# Patient Record
Sex: Female | Born: 1990 | Race: Black or African American | Hispanic: No | Marital: Single | Smoking: Never smoker
Health system: Southern US, Community
[De-identification: ages and names within clinical notes are randomized; demographics above are authoritative.]

## PROBLEM LIST (undated history)

## (undated) DIAGNOSIS — J45909 Unspecified asthma, uncomplicated: Secondary | ICD-10-CM

## (undated) DIAGNOSIS — E669 Obesity, unspecified: Secondary | ICD-10-CM

## (undated) DIAGNOSIS — E079 Disorder of thyroid, unspecified: Secondary | ICD-10-CM

## (undated) HISTORY — DX: Obesity, unspecified: E66.9

## (undated) HISTORY — PX: NO PAST SURGERIES: SHX2092

## (undated) HISTORY — DX: Unspecified asthma, uncomplicated: J45.909

---

## 2015-10-02 ENCOUNTER — Other Ambulatory Visit (HOSPITAL_COMMUNITY)
Admission: RE | Admit: 2015-10-02 | Discharge: 2015-10-02 | Disposition: A | Discharge status: Home / Self Care | Payer: BLUE CROSS/BLUE SHIELD | Source: Ambulatory Visit | Attending: Nurse Practitioner | Admitting: Nurse Practitioner

## 2015-10-02 ENCOUNTER — Other Ambulatory Visit: Payer: Self-pay | Admitting: Nurse Practitioner

## 2015-10-02 DIAGNOSIS — Z113 Encounter for screening for infections with a predominantly sexual mode of transmission: Secondary | ICD-10-CM | POA: Diagnosis present

## 2015-10-02 DIAGNOSIS — Z01411 Encounter for gynecological examination (general) (routine) with abnormal findings: Secondary | ICD-10-CM | POA: Insufficient documentation

## 2015-10-07 LAB — CYTOLOGY - PAP

## 2016-10-05 ENCOUNTER — Other Ambulatory Visit: Payer: Self-pay | Admitting: Nurse Practitioner

## 2016-10-05 ENCOUNTER — Other Ambulatory Visit (HOSPITAL_COMMUNITY)
Admission: RE | Admit: 2016-10-05 | Discharge: 2016-10-05 | Disposition: A | Discharge status: Home / Self Care | Payer: BLUE CROSS/BLUE SHIELD | Source: Ambulatory Visit | Attending: Nurse Practitioner | Admitting: Nurse Practitioner

## 2016-10-05 DIAGNOSIS — Z124 Encounter for screening for malignant neoplasm of cervix: Secondary | ICD-10-CM | POA: Diagnosis present

## 2016-10-08 LAB — CYTOLOGY - PAP
Chlamydia: NEGATIVE
Neisseria Gonorrhea: NEGATIVE

## 2016-11-04 ENCOUNTER — Other Ambulatory Visit: Payer: Self-pay | Admitting: Nurse Practitioner

## 2017-10-06 ENCOUNTER — Other Ambulatory Visit (HOSPITAL_COMMUNITY)
Admission: RE | Admit: 2017-10-06 | Discharge: 2017-10-06 | Disposition: A | Discharge status: Home / Self Care | Payer: BLUE CROSS/BLUE SHIELD | Source: Ambulatory Visit | Attending: Obstetrics and Gynecology | Admitting: Obstetrics and Gynecology

## 2017-10-06 ENCOUNTER — Other Ambulatory Visit: Payer: Self-pay | Admitting: Nurse Practitioner

## 2017-10-06 DIAGNOSIS — Z01419 Encounter for gynecological examination (general) (routine) without abnormal findings: Secondary | ICD-10-CM | POA: Insufficient documentation

## 2017-10-07 LAB — CYTOLOGY - PAP: Diagnosis: NEGATIVE

## 2018-10-10 ENCOUNTER — Other Ambulatory Visit (HOSPITAL_COMMUNITY)
Admission: RE | Admit: 2018-10-10 | Discharge: 2018-10-10 | Disposition: A | Discharge status: Home / Self Care | Payer: BLUE CROSS/BLUE SHIELD | Source: Ambulatory Visit | Attending: Nurse Practitioner | Admitting: Nurse Practitioner

## 2018-10-10 DIAGNOSIS — Z124 Encounter for screening for malignant neoplasm of cervix: Secondary | ICD-10-CM | POA: Insufficient documentation

## 2018-10-26 ENCOUNTER — Inpatient Hospital Stay (HOSPITAL_COMMUNITY)
Admission: EM | Admit: 2018-10-26 | Discharge: 2018-10-31 | DRG: 177 | Disposition: A | Discharge status: Home / Self Care | Payer: HRSA Program | Attending: Internal Medicine | Admitting: Internal Medicine

## 2018-10-26 ENCOUNTER — Emergency Department (HOSPITAL_COMMUNITY): Payer: HRSA Program

## 2018-10-26 ENCOUNTER — Encounter (HOSPITAL_COMMUNITY): Payer: Self-pay | Admitting: Emergency Medicine

## 2018-10-26 ENCOUNTER — Other Ambulatory Visit: Payer: Self-pay

## 2018-10-26 DIAGNOSIS — E876 Hypokalemia: Secondary | ICD-10-CM | POA: Diagnosis present

## 2018-10-26 DIAGNOSIS — Z6836 Body mass index (BMI) 36.0-36.9, adult: Secondary | ICD-10-CM

## 2018-10-26 DIAGNOSIS — J1289 Other viral pneumonia: Secondary | ICD-10-CM | POA: Diagnosis present

## 2018-10-26 DIAGNOSIS — E079 Disorder of thyroid, unspecified: Secondary | ICD-10-CM | POA: Diagnosis present

## 2018-10-26 DIAGNOSIS — U071 COVID-19: Secondary | ICD-10-CM | POA: Diagnosis not present

## 2018-10-26 DIAGNOSIS — E039 Hypothyroidism, unspecified: Secondary | ICD-10-CM | POA: Diagnosis present

## 2018-10-26 DIAGNOSIS — R0602 Shortness of breath: Secondary | ICD-10-CM

## 2018-10-26 DIAGNOSIS — J9601 Acute respiratory failure with hypoxia: Secondary | ICD-10-CM | POA: Diagnosis present

## 2018-10-26 DIAGNOSIS — Z79890 Hormone replacement therapy (postmenopausal): Secondary | ICD-10-CM

## 2018-10-26 DIAGNOSIS — R7401 Elevation of levels of liver transaminase levels: Secondary | ICD-10-CM | POA: Diagnosis present

## 2018-10-26 DIAGNOSIS — E669 Obesity, unspecified: Secondary | ICD-10-CM | POA: Diagnosis present

## 2018-10-26 DIAGNOSIS — J1282 Pneumonia due to coronavirus disease 2019: Secondary | ICD-10-CM | POA: Diagnosis present

## 2018-10-26 HISTORY — DX: Disorder of thyroid, unspecified: E07.9

## 2018-10-26 LAB — COMPREHENSIVE METABOLIC PANEL
ALT: 100 U/L — ABNORMAL HIGH (ref 0–44)
AST: 141 U/L — ABNORMAL HIGH (ref 15–41)
Albumin: 3.6 g/dL (ref 3.5–5.0)
Alkaline Phosphatase: 58 U/L (ref 38–126)
Anion gap: 13 (ref 5–15)
BUN: 6 mg/dL (ref 6–20)
CO2: 25 mmol/L (ref 22–32)
Calcium: 8.8 mg/dL — ABNORMAL LOW (ref 8.9–10.3)
Chloride: 101 mmol/L (ref 98–111)
Creatinine, Ser: 0.83 mg/dL (ref 0.44–1.00)
GFR calc Af Amer: >60 mL/min (ref >60)
GFR calc non Af Amer: >60 mL/min (ref >60)
Glucose, Bld: 139 mg/dL — ABNORMAL HIGH (ref 70–99)
Potassium: 2.9 mmol/L — ABNORMAL LOW (ref 3.5–5.1)
Sodium: 139 mmol/L (ref 135–145)
Total Bilirubin: 0.5 mg/dL (ref 0.3–1.2)
Total Protein: 8 g/dL (ref 6.5–8.1)

## 2018-10-26 LAB — CBC WITH DIFFERENTIAL/PLATELET
Abs Immature Granulocytes: 0.09 10*3/uL — ABNORMAL HIGH (ref 0.00–0.07)
Basophils Absolute: 0 10*3/uL (ref 0.0–0.1)
Basophils Relative: 1 %
Eosinophils Absolute: 0 10*3/uL (ref 0.0–0.5)
Eosinophils Relative: 0 %
HCT: 40.1 % (ref 36.0–46.0)
Hemoglobin: 13 g/dL (ref 12.0–15.0)
Immature Granulocytes: 1 %
Lymphocytes Relative: 33 %
Lymphs Abs: 2.1 10*3/uL (ref 0.7–4.0)
MCH: 30.4 pg (ref 26.0–34.0)
MCHC: 32.4 g/dL (ref 30.0–36.0)
MCV: 93.9 fL (ref 80.0–100.0)
Monocytes Absolute: 0.5 10*3/uL (ref 0.1–1.0)
Monocytes Relative: 8 %
Neutro Abs: 3.7 10*3/uL (ref 1.7–7.7)
Neutrophils Relative %: 57 %
Platelets: 217 10*3/uL (ref 150–400)
RBC: 4.27 MIL/uL (ref 3.87–5.11)
RDW: 13.4 % (ref 11.5–15.5)
WBC: 6.4 10*3/uL (ref 4.0–10.5)
nRBC: 0 % (ref 0.0–0.2)

## 2018-10-26 LAB — I-STAT BETA HCG BLOOD, ED (MC, WL, AP ONLY): I-stat hCG, quantitative: <5 m[IU]/mL (ref <5)

## 2018-10-26 LAB — LACTIC ACID, PLASMA: Lactic Acid, Venous: 1.4 mmol/L (ref 0.5–1.9)

## 2018-10-26 MED ORDER — ALBUTEROL SULFATE (2.5 MG/3ML) 0.083% IN NEBU: 5.0000 mg | INHALATION_SOLUTION | Freq: Once | RESPIRATORY_TRACT | Status: DC

## 2018-10-26 NOTE — ED Triage Notes (Signed)
Pt come to ed via pov,siste4r drove her to [facility], short of breathe and coughing. Her spo2 detector at home showed number of 84.  Pt is alert x4, able to communicate  needs on room air.  Pt was diagnosed at [facility] as covid positive.   Pt has a medical hx of graves disease and hyperthyroidism associated.

## 2018-10-26 NOTE — ED Notes (Signed)
Full rainbow and set of cultures sent down

## 2018-10-26 NOTE — ED Triage Notes (Signed)
Patient is complaining of a cough and sob. Patient has positive covid test from [facility].

## 2018-10-27 ENCOUNTER — Encounter (HOSPITAL_COMMUNITY): Payer: Self-pay | Admitting: Emergency Medicine

## 2018-10-27 DIAGNOSIS — U071 COVID-19: Secondary | ICD-10-CM | POA: Diagnosis present

## 2018-10-27 DIAGNOSIS — E039 Hypothyroidism, unspecified: Secondary | ICD-10-CM | POA: Diagnosis present

## 2018-10-27 DIAGNOSIS — E876 Hypokalemia: Secondary | ICD-10-CM | POA: Diagnosis present

## 2018-10-27 DIAGNOSIS — J9601 Acute respiratory failure with hypoxia: Secondary | ICD-10-CM | POA: Diagnosis present

## 2018-10-27 DIAGNOSIS — J1282 Pneumonia due to coronavirus disease 2019: Secondary | ICD-10-CM | POA: Diagnosis present

## 2018-10-27 DIAGNOSIS — R7401 Elevation of levels of liver transaminase levels: Secondary | ICD-10-CM

## 2018-10-27 DIAGNOSIS — Z6836 Body mass index (BMI) 36.0-36.9, adult: Secondary | ICD-10-CM | POA: Diagnosis not present

## 2018-10-27 DIAGNOSIS — E669 Obesity, unspecified: Secondary | ICD-10-CM | POA: Diagnosis present

## 2018-10-27 DIAGNOSIS — E079 Disorder of thyroid, unspecified: Secondary | ICD-10-CM

## 2018-10-27 DIAGNOSIS — J1289 Other viral pneumonia: Secondary | ICD-10-CM

## 2018-10-27 DIAGNOSIS — Z79890 Hormone replacement therapy (postmenopausal): Secondary | ICD-10-CM | POA: Diagnosis not present

## 2018-10-27 DIAGNOSIS — R0602 Shortness of breath: Secondary | ICD-10-CM | POA: Diagnosis present

## 2018-10-27 LAB — MAGNESIUM
Magnesium: 2.1 mg/dL (ref 1.7–2.4)
Magnesium: 2.9 mg/dL — ABNORMAL HIGH (ref 1.7–2.4)

## 2018-10-27 LAB — CBC WITH DIFFERENTIAL/PLATELET
Abs Immature Granulocytes: 0.14 10*3/uL — ABNORMAL HIGH (ref 0.00–0.07)
Basophils Absolute: 0 10*3/uL (ref 0.0–0.1)
Basophils Relative: 1 %
Eosinophils Absolute: 0 10*3/uL (ref 0.0–0.5)
Eosinophils Relative: 0 %
HCT: 39.9 % (ref 36.0–46.0)
Hemoglobin: 12.9 g/dL (ref 12.0–15.0)
Immature Granulocytes: 3 %
Lymphocytes Relative: 27 %
Lymphs Abs: 1.5 10*3/uL (ref 0.7–4.0)
MCH: 30.5 pg (ref 26.0–34.0)
MCHC: 32.3 g/dL (ref 30.0–36.0)
MCV: 94.3 fL (ref 80.0–100.0)
Monocytes Absolute: 0.3 10*3/uL (ref 0.1–1.0)
Monocytes Relative: 5 %
Neutro Abs: 3.6 10*3/uL (ref 1.7–7.7)
Neutrophils Relative %: 64 %
Platelets: 238 10*3/uL (ref 150–400)
RBC: 4.23 MIL/uL (ref 3.87–5.11)
RDW: 13.5 % (ref 11.5–15.5)
WBC: 5.5 10*3/uL (ref 4.0–10.5)
nRBC: 0 % (ref 0.0–0.2)

## 2018-10-27 LAB — SARS CORONAVIRUS 2 BY RT PCR (HOSPITAL ORDER, PERFORMED IN ~~LOC~~ HOSPITAL LAB): SARS Coronavirus 2: POSITIVE — AB

## 2018-10-27 LAB — COMPREHENSIVE METABOLIC PANEL
ALT: 104 U/L — ABNORMAL HIGH (ref 0–44)
AST: 148 U/L — ABNORMAL HIGH (ref 15–41)
Albumin: 3.7 g/dL (ref 3.5–5.0)
Alkaline Phosphatase: 54 U/L (ref 38–126)
Anion gap: 10 (ref 5–15)
BUN: 6 mg/dL (ref 6–20)
CO2: 24 mmol/L (ref 22–32)
Calcium: 8.6 mg/dL — ABNORMAL LOW (ref 8.9–10.3)
Chloride: 104 mmol/L (ref 98–111)
Creatinine, Ser: 0.77 mg/dL (ref 0.44–1.00)
GFR calc Af Amer: >60 mL/min (ref >60)
GFR calc non Af Amer: >60 mL/min (ref >60)
Glucose, Bld: 160 mg/dL — ABNORMAL HIGH (ref 70–99)
Potassium: 4.3 mmol/L (ref 3.5–5.1)
Sodium: 138 mmol/L (ref 135–145)
Total Bilirubin: 0.4 mg/dL (ref 0.3–1.2)
Total Protein: 8.1 g/dL (ref 6.5–8.1)

## 2018-10-27 LAB — ABO/RH: ABO/RH(D): A POS

## 2018-10-27 LAB — PHOSPHORUS: Phosphorus: 2.7 mg/dL (ref 2.5–4.6)

## 2018-10-27 LAB — PROCALCITONIN: Procalcitonin: <0.1 ng/mL

## 2018-10-27 LAB — FERRITIN: Ferritin: 979 ng/mL — ABNORMAL HIGH (ref 11–307)

## 2018-10-27 LAB — C-REACTIVE PROTEIN: CRP: 11.5 mg/dL — ABNORMAL HIGH (ref <1.0)

## 2018-10-27 LAB — HIV ANTIBODY (ROUTINE TESTING W REFLEX): HIV Screen 4th Generation wRfx: NONREACTIVE

## 2018-10-27 LAB — D-DIMER, QUANTITATIVE: D-Dimer, Quant: 0.8 ug/mL-FEU — ABNORMAL HIGH (ref 0.00–0.50)

## 2018-10-27 MED ORDER — LEVOTHYROXINE SODIUM 75 MCG PO TABS
175.0000 ug | ORAL_TABLET | Freq: Every day | ORAL | Status: DC
  Administered 2018-10-27: 175 ug via ORAL
  Filled 2018-10-27: qty 1

## 2018-10-27 MED ORDER — ONDANSETRON HCL 4 MG PO TABS: 4.0000 mg | ORAL_TABLET | Freq: Four times a day (QID) | ORAL | Status: DC | PRN

## 2018-10-27 MED ORDER — SENNOSIDES-DOCUSATE SODIUM 8.6-50 MG PO TABS: 1.0000 | ORAL_TABLET | Freq: Every evening | ORAL | Status: DC | PRN

## 2018-10-27 MED ORDER — LEVOTHYROXINE SODIUM 75 MCG PO TABS
175.0000 ug | ORAL_TABLET | Freq: Every day | ORAL | Status: DC
  Administered 2018-10-28 – 2018-10-31 (×4): 175 ug via ORAL
  Filled 2018-10-27 (×4): qty 1

## 2018-10-27 MED ORDER — DEXAMETHASONE SODIUM PHOSPHATE 10 MG/ML IJ SOLN
6.0000 mg | INTRAMUSCULAR | Status: DC
  Administered 2018-10-27 – 2018-10-29 (×3): 6 mg via INTRAVENOUS
  Filled 2018-10-27 (×3): qty 1

## 2018-10-27 MED ORDER — ALBUTEROL SULFATE HFA 108 (90 BASE) MCG/ACT IN AERS
2.0000 | INHALATION_SPRAY | Freq: Four times a day (QID) | RESPIRATORY_TRACT | Status: DC
  Administered 2018-10-27: 2 via RESPIRATORY_TRACT
  Filled 2018-10-27: qty 6.7

## 2018-10-27 MED ORDER — ENOXAPARIN SODIUM 60 MG/0.6ML ~~LOC~~ SOLN
50.0000 mg | SUBCUTANEOUS | Status: DC
  Administered 2018-10-28 – 2018-10-31 (×4): 50 mg via SUBCUTANEOUS
  Filled 2018-10-27 (×6): qty 0.6

## 2018-10-27 MED ORDER — GUAIFENESIN 100 MG/5ML PO SOLN: 5.0000 mL | ORAL | Status: DC | PRN

## 2018-10-27 MED ORDER — ENOXAPARIN SODIUM 40 MG/0.4ML ~~LOC~~ SOLN
40.0000 mg | Freq: Every day | SUBCUTANEOUS | Status: DC
  Administered 2018-10-27: 40 mg via SUBCUTANEOUS
  Filled 2018-10-27: qty 0.4

## 2018-10-27 MED ORDER — DEXAMETHASONE SODIUM PHOSPHATE 10 MG/ML IJ SOLN: 6.0000 mg | INTRAMUSCULAR | Status: DC

## 2018-10-27 MED ORDER — ACETAMINOPHEN 325 MG PO TABS
650.0000 mg | ORAL_TABLET | Freq: Four times a day (QID) | ORAL | Status: DC | PRN
  Administered 2018-10-27: 650 mg via ORAL
  Filled 2018-10-27: qty 2

## 2018-10-27 MED ORDER — POTASSIUM CHLORIDE CRYS ER 20 MEQ PO TBCR
40.0000 meq | EXTENDED_RELEASE_TABLET | Freq: Once | ORAL | Status: AC
  Administered 2018-10-27: 40 meq via ORAL
  Filled 2018-10-27: qty 2

## 2018-10-27 MED ORDER — ONDANSETRON HCL 4 MG/2ML IJ SOLN: 4.0000 mg | Freq: Four times a day (QID) | INTRAMUSCULAR | Status: DC | PRN

## 2018-10-27 MED ORDER — SODIUM CHLORIDE 0.9% FLUSH
3.0000 mL | Freq: Two times a day (BID) | INTRAVENOUS | Status: DC
  Administered 2018-10-27 – 2018-10-29 (×5): 3 mL via INTRAVENOUS

## 2018-10-27 MED ORDER — SODIUM CHLORIDE 0.9% FLUSH
3.0000 mL | Freq: Two times a day (BID) | INTRAVENOUS | Status: DC
  Administered 2018-10-28 – 2018-10-31 (×6): 3 mL via INTRAVENOUS

## 2018-10-27 MED ORDER — DEXAMETHASONE SODIUM PHOSPHATE 10 MG/ML IJ SOLN
6.0000 mg | Freq: Once | INTRAMUSCULAR | Status: AC
  Administered 2018-10-27: 6 mg via INTRAVENOUS
  Filled 2018-10-27: qty 1

## 2018-10-27 MED ORDER — SODIUM CHLORIDE 0.9% FLUSH: 3.0000 mL | INTRAVENOUS | Status: DC | PRN

## 2018-10-27 MED ORDER — SODIUM CHLORIDE 0.9 % IV SOLN: 100.0000 mg | INTRAVENOUS | Status: DC

## 2018-10-27 MED ORDER — ZOLPIDEM TARTRATE 5 MG PO TABS: 5.0000 mg | ORAL_TABLET | Freq: Every evening | ORAL | Status: DC | PRN

## 2018-10-27 MED ORDER — SODIUM CHLORIDE 0.9 % IV SOLN
100.0000 mg | INTRAVENOUS | Status: DC
  Administered 2018-10-28 – 2018-10-30 (×3): 100 mg via INTRAVENOUS
  Filled 2018-10-27 (×4): qty 20

## 2018-10-27 MED ORDER — SODIUM CHLORIDE 0.9 % IV SOLN: 250.0000 mL | INTRAVENOUS | Status: DC | PRN

## 2018-10-27 MED ORDER — MAGNESIUM SULFATE IN D5W 1-5 GM/100ML-% IV SOLN
1.0000 g | Freq: Once | INTRAVENOUS | Status: AC
  Administered 2018-10-27: 1 g via INTRAVENOUS
  Filled 2018-10-27: qty 100

## 2018-10-27 MED ORDER — SODIUM CHLORIDE 0.9 % IV SOLN
200.0000 mg | Freq: Once | INTRAVENOUS | Status: AC
  Administered 2018-10-27: 200 mg via INTRAVENOUS
  Filled 2018-10-27: qty 40

## 2018-10-27 MED ORDER — HYDROCODONE-ACETAMINOPHEN 5-325 MG PO TABS: 1.0000 | ORAL_TABLET | ORAL | Status: DC | PRN

## 2018-10-27 MED ORDER — POTASSIUM CHLORIDE 10 MEQ/100ML IV SOLN
10.0000 meq | INTRAVENOUS | Status: AC
  Administered 2018-10-27 (×2): 10 meq via INTRAVENOUS
  Filled 2018-10-27 (×2): qty 100

## 2018-10-27 NOTE — Progress Notes (Signed)
CXR shows Patchy airspace opacities in both lungs, right greater than left. The findings in the lungs are nonspecific, but concerning for atypical infection, which includes viral pneumonia.  Pt requiring supplemental oxygen (Yes--2L) ALT 100  Plan: Remdesivir 200mg  IV now then 100mg  IV daily x 4 days Will f/u ALT and pt's clinical condition

## 2018-10-27 NOTE — Plan of Care (Signed)
Called to give update to mom - but no answer. Left voicemail with return number to call. Waiting on return call.

## 2018-10-27 NOTE — ED Notes (Signed)
[facility] called for patient transfer to [facility]. Attempted to call report to [facility]- was told the RN is in a room and unavailable at this time. Will re-attempt.

## 2018-10-27 NOTE — ED Provider Notes (Signed)
[facility] DEPT Provider Note   CSN: [id number] Arrival date & time: 10/26/18  2155     History   Chief Complaint Chief Complaint  Patient presents with  . Cough  . Covid Positive  . Shortness of Breath    started on tuesday     HPI Diana Chavez is a 28 y.o. female.     The history is provided by the patient.  Cough Cough characteristics:  Non-productive Severity:  Moderate Onset quality:  Gradual Duration:  7 days Timing:  Intermittent Progression:  Worsening Chronicity:  New Context: sick contacts   Context: not animal exposure   Relieved by:  Nothing Worsened by:  Nothing Ineffective treatments:  None tried Associated symptoms: chills, fever and shortness of breath   Associated symptoms comment:  Anosmia and loss of taste Risk factors: no recent infection   Shortness of Breath Associated symptoms: cough and fever   Associated symptoms: no abdominal pain   Patient started having symptoms on Friday.  Tested positive for COVID on Tuesday.  SOB began on Tuesday.    Past Medical History:  Diagnosis Date  . Thyroid disease     There are no active problems to display for this patient.   History reviewed. No pertinent surgical history.   OB History   No obstetric history on file.      Home Medications    Prior to Admission medications   Medication Sig Start Date End Date Taking? Authorizing Provider  albuterol (VENTOLIN HFA) 108 (90 Base) MCG/ACT inhaler Inhale 1-2 puffs into the lungs every 6 (six) hours as needed for wheezing or shortness of breath.   Yes [provider]  levothyroxine (SYNTHROID) 175 MCG tablet Take 175 mcg by mouth daily before breakfast.   Yes [provider]    Family History No family history on file.  Social History Social History   Tobacco Use  . Smoking status: Not on file  Substance Use Topics  . Alcohol use: Not on file  . Drug use: Not on file      Allergies   Patient has no known allergies.   Review of Systems Review of Systems  Constitutional: Positive for chills and fever.  HENT: Negative for voice change.   Eyes: Negative for visual disturbance.  Respiratory: Positive for cough and shortness of breath.   Gastrointestinal: Negative for abdominal pain.  Genitourinary: Negative for difficulty urinating.  Musculoskeletal: Negative for arthralgias.  Neurological: Negative for weakness.  Psychiatric/Behavioral: Negative for agitation.  All other systems reviewed and are negative.    Physical Exam Updated Vital Signs BP 125/88   Pulse 92   Temp 99.8 F (37.7 C) (Oral)   Resp (!) 30   Ht 5\' 5"  (1.651 m)   SpO2 95%   Physical Exam Vitals signs and nursing note reviewed.  Constitutional:      General: She is not in acute distress. HENT:     Head: Normocephalic and atraumatic.     Nose: Nose normal.  Eyes:     Conjunctiva/sclera: Conjunctivae normal.     Pupils: Pupils are equal, round, and reactive to light.  Neck:     Musculoskeletal: Normal range of motion and neck supple.  Cardiovascular:     Rate and Rhythm: Regular rhythm. Tachycardia present.     Pulses: Normal pulses.     Heart sounds: Normal heart sounds.  Pulmonary:     Effort: Tachypnea present.     Breath sounds: No stridor.  Abdominal:     General: Abdomen is flat. Bowel sounds are normal.     Tenderness: There is no abdominal tenderness. There is no guarding.  Musculoskeletal: Normal range of motion.  Skin:    General: Skin is warm and dry.     Capillary Refill: Capillary refill takes less than 2 seconds.  Neurological:     General: No focal deficit present.     Mental Status: She is alert and oriented to person, place, and time.     Deep Tendon Reflexes: Reflexes normal.  Psychiatric:        Mood and Affect: Mood normal.        Behavior: Behavior normal.      ED Treatments / Results  Labs (all labs ordered are listed, but only  abnormal results are displayed) Results for orders placed or performed during the hospital encounter of 10/26/18  SARS Coronavirus 2 by RT PCR (hospital order, performed in [facility] hospital lab) Nasopharyngeal Nasopharyngeal Swab   Specimen: Nasopharyngeal Swab  Result Value Ref Range   SARS Coronavirus 2 POSITIVE (A) NEGATIVE  CBC with Differential  Result Value Ref Range   WBC 6.4 4.0 - 10.5 K/uL   RBC 4.27 3.87 - 5.11 MIL/uL   Hemoglobin 13.0 12.0 - 15.0 g/dL   HCT 47.0 96.2 - 83.6 %   MCV 93.9 80.0 - 100.0 fL   MCH 30.4 26.0 - 34.0 pg   MCHC 32.4 30.0 - 36.0 g/dL   RDW 62.9 47.6 - 54.6 %   Platelets 217 150 - 400 K/uL   nRBC 0.0 0.0 - 0.2 %   Neutrophils Relative % 57 %   Neutro Abs 3.7 1.7 - 7.7 K/uL   Lymphocytes Relative 33 %   Lymphs Abs 2.1 0.7 - 4.0 K/uL   Monocytes Relative 8 %   Monocytes Absolute 0.5 0.1 - 1.0 K/uL   Eosinophils Relative 0 %   Eosinophils Absolute 0.0 0.0 - 0.5 K/uL   Basophils Relative 1 %   Basophils Absolute 0.0 0.0 - 0.1 K/uL   Immature Granulocytes 1 %   Abs Immature Granulocytes 0.09 (H) 0.00 - 0.07 K/uL   Reactive, Benign Lymphocytes PRESENT   Comprehensive metabolic panel  Result Value Ref Range   Sodium 139 135 - 145 mmol/L   Potassium 2.9 (L) 3.5 - 5.1 mmol/L   Chloride 101 98 - 111 mmol/L   CO2 25 22 - 32 mmol/L   Glucose, Bld 139 (H) 70 - 99 mg/dL   BUN 6 6 - 20 mg/dL   Creatinine, Ser 5.03 0.44 - 1.00 mg/dL   Calcium 8.8 (L) 8.9 - 10.3 mg/dL   Total Protein 8.0 6.5 - 8.1 g/dL   Albumin 3.6 3.5 - 5.0 g/dL   AST 546 (H) 15 - 41 U/L   ALT 100 (H) 0 - 44 U/L   Alkaline Phosphatase 58 38 - 126 U/L   Total Bilirubin 0.5 0.3 - 1.2 mg/dL   GFR calc non Af Amer >60 >60 mL/min   GFR calc Af Amer >60 >60 mL/min   Anion gap 13 5 - 15  Lactic acid, plasma  Result Value Ref Range   Lactic Acid, Venous 1.4 0.5 - 1.9 mmol/L  I-Stat Beta hCG blood, ED ([facility], [facility], [facility] only)  Result Value Ref Range   I-stat hCG, quantitative <5.0 <5  mIU/mL   Comment 3           Dg Chest Port 1 View  Result Date:  10/26/2018 CLINICAL DATA:  Covid positive, cough EXAM: PORTABLE CHEST 1 VIEW COMPARISON:  None. FINDINGS: The heart size and mediastinal contours are within normal limits. Patchy airspace consolidation seen at the base of the right lung and left mid lung. Overall shallow degree of aeration. IMPRESSION: Patchy airspace opacities in both lungs, right greater than left. The findings in the lungs are nonspecific, but concerning for atypical infection, which includes viral pneumonia. Electronically Signed   By: Jonna ClarkBindu  Avutu M.D.   On: 10/26/2018 22:43    EKG None  [facility]  Result Date: 10/26/2018 CLINICAL DATA:  Covid positive, cough EXAM: PORTABLE CHEST 1 VIEW COMPARISON:  None. FINDINGS: The heart size and mediastinal contours are within normal limits. Patchy airspace consolidation seen at the base of the right lung and left mid lung. Overall shallow degree of aeration. IMPRESSION: Patchy airspace opacities in both lungs, right greater than left. The findings in the lungs are nonspecific, but concerning for atypical infection, which includes viral pneumonia. Electronically Signed   By: Jonna ClarkBindu  Avutu M.D.   On: 10/26/2018 22:43    Procedures Procedures (including critical care time)  Medications Ordered in ED Medications  dexamethasone (DECADRON) injection 6 mg (has no administration in time range)     Initial Impression / Assessment and Plan / ED Course  Patient is hypoxic on room air and Desaturated with ambulation.  She will require admission.    Final Clinical Impressions(s) / ED Diagnoses   Final diagnoses:  COVID-19    Admit to medicine   Diavian Furgason, MD 10/27/18 [id number]

## 2018-10-27 NOTE — H&P (Signed)
History and Physical    Menucha Dicesare OEU:[id number] DOB: June 04, 1990 DOA: 10/26/2018  PCP: Patient, No Pcp Per   Patient coming from: Home   Chief Complaint: SOB, cough, fevers, COVID-19 positive   HPI: Brittnie Bertoli is a 28 y.o. female with medical history significant for thyroid disease, now presenting to the emergency department for evaluation of cough, shortness of breath, fevers, and reports that she was positive for COVID-19 a week ago.  Patient developed fevers, cough, loss of taste and smell, and shortness of breath on 10/17/2018, tested positive for the novel coronavirus the following day, and now presents with worsening shortness of breath.  Patient reports that she continues to have fevers, chills, and cough, but her main concern is her dyspnea.  She is short of breath even at rest now.  She denies any history of asthma or underlying heart or lung disease, but was given an albuterol inhaler that she has been using during this illness, but has not noted any improvement with this.  She has some mild nausea, has vomited once, denies abdominal pain, denies diarrhea, and denies chest pain.  ED Course: Upon arrival to the ED, patient is found to be afebrile, saturating upper 80s on room air while at rest, tachypneic in the 30s, slightly tachycardic, and with stable blood pressure.  EKG features sinus tachycardia with rate 105.  Chest x-ray is notable for patchy airspace opacities bilaterally concerning for atypical infection.  Chemistry panel is notable for potassium 2.9, AST 141, and ALT of 100.  CBC is unremarkable.  Lactic acid is normal.  COVID-19 testing is positive.  Blood cultures were collected in the ED and the patient was treated with Decadron and started on supplemental oxygen.  Review of Systems:  All other systems reviewed and apart from HPI, are negative.  Past Medical History:  Diagnosis Date  . Thyroid disease     History reviewed. No pertinent surgical history.    has no history on file for tobacco, alcohol, and drug.  No Known Allergies  History reviewed. No pertinent family history.   Prior to Admission medications   Medication Sig Start Date End Date Taking? Authorizing Provider  albuterol (VENTOLIN HFA) 108 (90 Base) MCG/ACT inhaler Inhale 1-2 puffs into the lungs every 6 (six) hours as needed for wheezing or shortness of breath.   Yes [provider]  levothyroxine (SYNTHROID) 175 MCG tablet Take 175 mcg by mouth daily before breakfast.   Yes [provider]    Physical Exam: Vitals:   10/26/18 2255 10/27/18 0100 10/27/18 0130 10/27/18 0200  BP:  125/88 138/89 138/85  Pulse:  92 91 91  Resp:  (!) 30 (!) 26 (!) 33  Temp:      TempSrc:      SpO2: (!) 89% 95% 92% 92%  Height: 5\' 5"  (1.651 m)       Constitutional: NAD, calm  Eyes: PERTLA, lids and conjunctivae normal ENMT: Mucous membranes are moist. Posterior pharynx clear of any exudate or lesions.   Neck: normal, supple, no masses, no thyromegaly Respiratory: Tachypneic at rest. Speaking full sentences. Scattered rhonchi bilaterally. No pallor or cyanosis.  Cardiovascular: S1 & S2 heard, regular rate and rhythm. No extremity edema.   Abdomen: No distension, no tenderness, soft. Bowel sounds normal.  Musculoskeletal: no clubbing / cyanosis. No joint deformity upper and lower extremities.    Skin: no significant rashes, lesions, ulcers. Warm, dry, well-perfused. Neurologic: No facial asymmetry. Sensation intact. Moving all extremities.  Psychiatric: Alert  and oriented to person, place, and situation. Calm, cooperative.    Labs on Admission: I have personally reviewed following labs and imaging studies  CBC: Recent Labs  Lab 10/26/18 2321  WBC 6.4  NEUTROABS 3.7  HGB 13.0  HCT 40.1  MCV 93.9  PLT 217   Basic Metabolic Panel: Recent Labs  Lab 10/26/18 2321  NA 139  K 2.9*  CL 101  CO2 25  GLUCOSE 139*  BUN 6  CREATININE 0.83  CALCIUM 8.8*    GFR: CrCl cannot be calculated (Unknown ideal weight.). Liver Function Tests: Recent Labs  Lab 10/26/18 2321  AST 141*  ALT 100*  ALKPHOS 58  BILITOT 0.5  PROT 8.0  ALBUMIN 3.6   No results for input(s): LIPASE, AMYLASE in the last 168 hours. No results for input(s): AMMONIA in the last 168 hours. Coagulation Profile: No results for input(s): INR, PROTIME in the last 168 hours. Cardiac Enzymes: No results for input(s): CKTOTAL, CKMB, CKMBINDEX, TROPONINI in the last 168 hours. BNP (last 3 results) No results for input(s): PROBNP in the last 8760 hours. HbA1C: No results for input(s): HGBA1C in the last 72 hours. CBG: No results for input(s): GLUCAP in the last 168 hours. Lipid Profile: No results for input(s): CHOL, HDL, LDLCALC, TRIG, CHOLHDL, LDLDIRECT in the last 72 hours. Thyroid Function Tests: No results for input(s): TSH, T4TOTAL, FREET4, T3FREE, THYROIDAB in the last 72 hours. Anemia Panel: No results for input(s): VITAMINB12, FOLATE, FERRITIN, TIBC, IRON, RETICCTPCT in the last 72 hours. Urine analysis: No results found for: COLORURINE, APPEARANCEUR, LABSPEC, PHURINE, GLUCOSEU, HGBUR, BILIRUBINUR, KETONESUR, PROTEINUR, UROBILINOGEN, NITRITE, LEUKOCYTESUR Sepsis Labs: @LABRCNTIP (procalcitonin:4,lacticidven:4) ) Recent Results (from the past 240 hour(s))  SARS Coronavirus 2 by RT PCR (hospital order, [facility]) Nasopharyngeal Nasopharyngeal Swab     Status: Abnormal   Collection Time: 10/27/18 12:10 AM   Specimen: Nasopharyngeal Swab  Result Value Ref Range Status   SARS Coronavirus 2 POSITIVE (A) NEGATIVE Final    Comment: RESULT CALLED TO, READ BACK BY AND VERIFIED WITH: Mervyn GayJOHN FRICKIE @ 0153 ON 10/27/2018 C VARNER (NOTE) If result is NEGATIVE SARS-CoV-2 target nucleic acids are NOT DETECTED. The SARS-CoV-2 RNA is generally detectable in upper and lower  respiratory specimens during the acute phase of infection. The lowest   concentration of SARS-CoV-2 viral copies this assay can detect is 250  copies / mL. A negative result does not preclude SARS-CoV-2 infection  and should not be used as the sole basis for treatment or other  patient management decisions.  A negative result may occur with  improper specimen collection / handling, submission of specimen other  than nasopharyngeal swab, presence of viral mutation(s) within the  areas targeted by this assay, and inadequate number of viral copies  (<250 copies / mL). A negative result must be combined with clinical  observations, patient history, and epidemiological information. If result is POSITIVE SARS-CoV-2 target nucleic acids are DETEC TED. The SARS-CoV-2 RNA is generally detectable in upper and lower  respiratory specimens during the acute phase of infection.  Positive  results are indicative of active infection with SARS-CoV-2.  Clinical  correlation with patient history and other diagnostic information is  necessary to determine patient infection status.  Positive results do  not rule out bacterial infection or co-infection with other viruses. If result is PRESUMPTIVE POSTIVE SARS-CoV-2 nucleic acids MAY BE PRESENT.   A presumptive positive result was obtained on the submitted specimen  and confirmed on repeat  testing.  While 2019 novel coronavirus  (SARS-CoV-2) nucleic acids may be present in the submitted sample  additional confirmatory testing may be necessary for epidemiological  and / or clinical management purposes  to differentiate between  SARS-CoV-2 and other Sarbecovirus currently known to infect humans.  If clinically indicated additional testing with an alternate test  methodology (LAB7 453) is advised. The SARS-CoV-2 RNA is generally  detectable in upper and lower respiratory specimens during the acute  phase of infection. The expected result is Negative. Fact Sheet for Patients:  BoilerBrush.com.cy Fact Sheet  for Healthcare Providers: https://pope.com/ This test is not yet approved or cleared by the [place] FDA and has been authorized for detection and/or diagnosis of SARS-CoV-2 by FDA under an Emergency Use Authorization (EUA).  This EUA will remain in effect (meaning this test can be used) for the duration of the COVID-19 declaration under Section 564(b)(1) of the Act, 21 U.S.C. section 360bbb-3(b)(1), unless the authorization is terminated or revoked sooner. Performed at [facility], [address] [place], [place] [address]      Radiological Exams on Admission: Dg Chest Port 1 View  Result Date: 10/26/2018 CLINICAL DATA:  Covid positive, cough EXAM: PORTABLE CHEST 1 VIEW COMPARISON:  None. FINDINGS: The heart size and mediastinal contours are within normal limits. Patchy airspace consolidation seen at the base of the right lung and left mid lung. Overall shallow degree of aeration. IMPRESSION: Patchy airspace opacities in both lungs, right greater than left. The findings in the lungs are nonspecific, but concerning for atypical infection, which includes viral pneumonia. Electronically Signed   By: Jonna Clark M.D.   On: 10/26/2018 22:43    EKG: Independently reviewed. Sinus tachycardia (rate 105), QTc 443 ms.   Assessment/Plan   1. Pneumonia due to COVID-19; acute hypoxic respiratory failure  - Patient reports fevers, cough, and SOB since 10/13, was positive for COVID-19 the following day, has become progressively dyspneic, and is found to be tachypneic in 30's with new 2 Lpm supplemental O2 requirement, and CXR-findings consistent with viral PNA  - Blood cultures were collected in the ED, she was started on 2 Lpm supplemental O2, and treated with Decadron  - Check/trend inflammatory markers, consider antibiotics if procalcitonin elevated, continue Decadron, start remdesivir, continue supplemental O2 as needed   2. Hypokalemia  - Serum  potassium is 2.9 on admission  - Replace, continue cardiac monitoring for now, repeat chemistries in am   3. Elevated transaminases - AST is 141 and ALT 100 on admission with no prior labs available for comparison  - Abdominal exam benign, likely secondary to the viral infection, will monitor   4. Hypothyroidism  - Continue Synthroid    PPE: CAPR, gown, gloves  DVT prophylaxis: Lovenox  Code Status: Full  Family Communication: Family updated by phone  Consults called: none  Admission status: Inpatient. Patient is tachypneic to mid-30's and hypoxic at rest, she can not be reasonably expected to improve enough for safe discharge within the observation timeframe and will require careful inpatient management.     Briscoe Deutscher, MD [facility] Pager [phone]  If 7PM-7AM, please contact night-coverage [email or web address] Password [email or web address]  10/27/2018, 2:11 AM

## 2018-10-27 NOTE — ED Notes (Signed)
[facility] present to transfer patient to [facility]

## 2018-10-27 NOTE — Progress Notes (Addendum)
Care started prior to midnight in the ED yesterday and she was admitted after midnight early this morning by my colleague Dr. Christia Reading Opyd and I am in current agreement with his assessment and plan.  Additional changes to the plan of care been made accordingly.  Patient's chart was reviewed earlier this morning and she is currently in the process of being transferred to [facility] to Dr. Sander Radon.  She presented with shortness of breath that started on 10/17/2018, cough, fevers, loss of taste and smell, and tested positive for COVID-19 disease on 10/18/2018 and now presented with worsening shortness of breath and progressive dyspnea with tachypnea.  She was found to have a new oxygen requirement required at least 2 L supplemental oxygen via nasal cannula and chest x-ray findings were consistent with a viral pneumonia.  She had blood cultures taken in the ED and started on IV Decadron and remdesivir.  Repeat laboratory work is still pending this morning but has been ordered.  Her potassium level was low on admission and is currently being replaced.  She will be transferred to [facility] and seen by Dr. Riccardo Dubin Arrien later today and further care per [facility] protocols.

## 2018-10-27 NOTE — ED Notes (Signed)
Pt requests her mom be updated in the morning, after 7am

## 2018-10-27 NOTE — Progress Notes (Signed)
PROGRESS NOTE    Diana Chavez  DXA:[id number] DOB: 11-Sep-1990 DOA: 10/26/2018 PCP: Patient, No Pcp Per    Brief Narrative:  28 year old female who presented with dyspnea, cough and fevers.  She does have significant past medical history for hypothyroidism.  She tested positive for COVID-19 on October 18, 2018, patient had persistent fevers, dyspnea, chills, and cough.  She was noted to be dyspneic at rest.  On her initial physical examination her oximetry was 80% on room air, respiratory rate 30 breaths/min, heart rate 92.  She was tachypneic, her lungs had rhonchi bilaterally, heart S1-S2 present rhythmic, tachycardic, abdomen soft, no lower extremity edema. Sodium 139, potassium 2.9, chloride 101, bicarb 25, glucose 139, BUN 6, creatinine 0.83, white count 6.4, hemoglobin 13.0, hematocrit 40.1, platelets 217.  SARS COVID-19 positive.  Her chest x-ray had hypoinflation, reticular nodular infiltrate at the right base, interstitial infiltrate left upper lobe.  EKG 105 bpm, normal axis, normal intervals, sinus rhythm, no ST segment changes, negative T waves in the inferior lateral leads.  Patient was admitted to the hospital with working diagnosis of acute hypoxic respiratory failure due to SARS COVID-19 viral pneumonia.   Assessment & Plan:   Principal Problem:   Pneumonia due to COVID-19 virus Active Problems:   Hypokalemia   Elevated transaminase level   Acute respiratory failure with hypoxia (HCC)   Thyroid disease   1. Acute hypoxic respiratory failure due to SARS COVID 19 viral pneumonia. Patient continue to have tachypnea and hypoxemia, no frank dyspnea (silent hypoxemia).   RR: 28 to 32 Fi02: 28% on 2 LPM per [place]  Pulse oxymetry: 92 to 94 %  COVID-19 Labs  Recent Labs    10/27/18 0544  DDIMER 0.80*  FERRITIN 979*  CRP 11.5*    Continue medical therapy with Remdesivir #1/5, systemic corticosteroids with dexamethasone, 6 mg IV q 24 H. Guaifenesin, vitamin C and  zinc. Will continue close monitoring of oxymetry.   2. Hypokalemia and hypomagnesemia. K up to 4,3 and Mg at 2,9 after correction with Kcl and Mg sulfate. Renal function stable with serum cr at 0,77. Will continue to follow renal function in am.   3. Obesity. Calculated BMI is 36,6.   4. Hypothyroid. Continue with levothyroxine.     DVT prophylaxis: enoxaparin   Code Status: full Family Communication: no family at the bedside  Disposition Plan/ discharge barriers: pending clinical improvement.   Body mass index is 36.61 kg/m. Malnutrition Type:      Malnutrition Characteristics:      Nutrition Interventions:     RN Pressure Injury Documentation:     Consultants:     Procedures:     Antimicrobials:       Subjective: Patient is feeling better, continue to have cough and weakness, no frank dyspnea, no nausea or vomiting.   Objective: Vitals:   10/27/18 0930 10/27/18 1000 10/27/18 1030 10/27/18 1300  BP: 132/82 (!) 139/96 (!) 149/101 131/89  Pulse: 86 82 83   Resp: (!) 29 (!) 30 (!) 33 (!) 28  Temp:    97.8 F (36.6 C)  TempSrc:    Oral  SpO2: 92% 91% 94% 94%  Weight:      Height:        Intake/Output Summary (Last 24 hours) at 10/27/2018 1511 Last data filed at 10/27/2018 1454 Gross per 24 hour  Intake 110 ml  Output -  Net 110 ml   Filed Weights   10/27/18 0717  Weight: 99.8 kg  Examination:   General: Not in pain or dyspnea.  Neurology: Awake and alert, non focal  E ENT: mild pallor, no icterus, oral mucosa moist Cardiovascular: No JVD. S1-S2 present, rhythmic, no gallops, rubs, or murmurs. No lower extremity edema. Pulmonary: positive breath sounds bilaterally. Gastrointestinal. Abdomen with no organomegaly, non tender, no rebound or guarding Skin. No rashes Musculoskeletal: no joint deformities     Data Reviewed: I have personally reviewed following labs and imaging studies  CBC: Recent Labs  Lab 10/26/18 2321 10/27/18  0843  WBC 6.4 5.5  NEUTROABS 3.7 3.6  HGB 13.0 12.9  HCT 40.1 39.9  MCV 93.9 94.3  PLT 217 161   Basic Metabolic Panel: Recent Labs  Lab 10/26/18 2321 10/27/18 0455 10/27/18 0843  NA 139  --  138  K 2.9*  --  4.3  CL 101  --  104  CO2 25  --  24  GLUCOSE 139*  --  160*  BUN 6  --  6  CREATININE 0.83  --  0.77  CALCIUM 8.8*  --  8.6*  MG  --  2.1 2.9*  PHOS  --   --  2.7   GFR: Estimated Creatinine Clearance: 124.7 mL/min (by C-G formula based on SCr of 0.77 mg/dL). Liver Function Tests: Recent Labs  Lab 10/26/18 2321 10/27/18 0843  AST 141* 148*  ALT 100* 104*  ALKPHOS 58 54  BILITOT 0.5 0.4  PROT 8.0 8.1  ALBUMIN 3.6 3.7   No results for input(s): LIPASE, AMYLASE in the last 168 hours. No results for input(s): AMMONIA in the last 168 hours. Coagulation Profile: No results for input(s): INR, PROTIME in the last 168 hours. Cardiac Enzymes: No results for input(s): CKTOTAL, CKMB, CKMBINDEX, TROPONINI in the last 168 hours. BNP (last 3 results) No results for input(s): PROBNP in the last 8760 hours. HbA1C: No results for input(s): HGBA1C in the last 72 hours. CBG: No results for input(s): GLUCAP in the last 168 hours. Lipid Profile: No results for input(s): CHOL, HDL, LDLCALC, TRIG, CHOLHDL, LDLDIRECT in the last 72 hours. Thyroid Function Tests: No results for input(s): TSH, T4TOTAL, FREET4, T3FREE, THYROIDAB in the last 72 hours. Anemia Panel: Recent Labs    10/27/18 0544  FERRITIN 979*      Radiology Studies: I have reviewed all of the imaging during this hospital visit personally     Scheduled Meds: . dexamethasone (DECADRON) injection  6 mg Intravenous Q24H  . [START ON 10/28/2018] enoxaparin (LOVENOX) injection  50 mg Subcutaneous Q24H  . [START ON 10/28/2018] levothyroxine  175 mcg Oral Q0600  . sodium chloride flush  3 mL Intravenous Q12H  . sodium chloride flush  3 mL Intravenous Q12H   Continuous Infusions: . sodium chloride    .  [START ON 10/28/2018] remdesivir 100 mg in NS 250 mL       LOS: 0 days         Gerome Apley, MD

## 2018-10-28 LAB — COMPREHENSIVE METABOLIC PANEL
ALT: 96 U/L — ABNORMAL HIGH (ref 0–44)
AST: 83 U/L — ABNORMAL HIGH (ref 15–41)
Albumin: 3.7 g/dL (ref 3.5–5.0)
Alkaline Phosphatase: 57 U/L (ref 38–126)
Anion gap: 12 (ref 5–15)
BUN: 10 mg/dL (ref 6–20)
CO2: 23 mmol/L (ref 22–32)
Calcium: 9.1 mg/dL (ref 8.9–10.3)
Chloride: 105 mmol/L (ref 98–111)
Creatinine, Ser: 0.74 mg/dL (ref 0.44–1.00)
GFR calc Af Amer: >60 mL/min (ref >60)
GFR calc non Af Amer: >60 mL/min (ref >60)
Glucose, Bld: 164 mg/dL — ABNORMAL HIGH (ref 70–99)
Potassium: 4.2 mmol/L (ref 3.5–5.1)
Sodium: 140 mmol/L (ref 135–145)
Total Bilirubin: 0.5 mg/dL (ref 0.3–1.2)
Total Protein: 8.5 g/dL — ABNORMAL HIGH (ref 6.5–8.1)

## 2018-10-28 LAB — CBC
HCT: 42.3 % (ref 36.0–46.0)
Hemoglobin: 13.9 g/dL (ref 12.0–15.0)
MCH: 30.8 pg (ref 26.0–34.0)
MCHC: 32.9 g/dL (ref 30.0–36.0)
MCV: 93.6 fL (ref 80.0–100.0)
Platelets: 311 10*3/uL (ref 150–400)
RBC: 4.52 MIL/uL (ref 3.87–5.11)
RDW: 13.6 % (ref 11.5–15.5)
WBC: 6.3 10*3/uL (ref 4.0–10.5)
nRBC: 0 % (ref 0.0–0.2)

## 2018-10-28 LAB — D-DIMER, QUANTITATIVE: D-Dimer, Quant: 0.87 ug/mL-FEU — ABNORMAL HIGH (ref 0.00–0.50)

## 2018-10-28 LAB — TYPE AND SCREEN
ABO/RH(D): A POS
Antibody Screen: NEGATIVE

## 2018-10-28 LAB — C-REACTIVE PROTEIN: CRP: 5.9 mg/dL — ABNORMAL HIGH (ref <1.0)

## 2018-10-28 NOTE — Plan of Care (Addendum)
Patient in bed resting. No s/s of pain or distress. Appetite is improving. Spoke to patients mom with updates. Will continue to monitor for remainder of shift.   Problem: Education: Goal: Knowledge of risk factors and measures for prevention of condition will improve 10/28/2018 1109 by Orvan Falconer, RN Outcome: Progressing 10/28/2018 1108 by Orvan Falconer, RN Outcome: Progressing   Problem: Coping: Goal: Psychosocial and spiritual needs will be supported 10/28/2018 1109 by Orvan Falconer, RN Outcome: Progressing 10/28/2018 1108 by Orvan Falconer, RN Outcome: Progressing   Problem: Respiratory: Goal: Will maintain a patent airway 10/28/2018 1109 by Orvan Falconer, RN Outcome: Progressing 10/28/2018 1108 by Orvan Falconer, RN Outcome: Progressing Goal: Complications related to the disease process, condition or treatment will be avoided or minimized 10/28/2018 1109 by Orvan Falconer, RN Outcome: Progressing 10/28/2018 1108 by Orvan Falconer, RN Outcome: Progressing   Problem: Education: Goal: Knowledge of General Education information will improve Description: Including pain rating scale, medication(s)/side effects and non-pharmacologic comfort measures 10/28/2018 1109 by Orvan Falconer, RN Outcome: Progressing 10/28/2018 1108 by Orvan Falconer, RN Outcome: Progressing   Problem: Health Behavior/Discharge Planning: Goal: Ability to manage health-related needs will improve 10/28/2018 1109 by Orvan Falconer, RN Outcome: Progressing 10/28/2018 1108 by Orvan Falconer, RN Outcome: Progressing   Problem: Clinical Measurements: Goal: Ability to maintain clinical measurements within normal limits will improve 10/28/2018 1109 by Orvan Falconer, RN Outcome: Progressing 10/28/2018 1108 by Orvan Falconer, RN Outcome: Progressing Goal: Will remain free from infection 10/28/2018 1109 by Orvan Falconer, RN Outcome:  Progressing 10/28/2018 1108 by Orvan Falconer, RN Outcome: Progressing Goal: Diagnostic test results will improve 10/28/2018 1109 by Orvan Falconer, RN Outcome: Progressing 10/28/2018 1108 by Orvan Falconer, RN Outcome: Progressing Goal: Respiratory complications will improve 10/28/2018 1109 by Orvan Falconer, RN Outcome: Progressing 10/28/2018 1108 by Orvan Falconer, RN Outcome: Progressing Goal: Cardiovascular complication will be avoided 10/28/2018 1109 by Orvan Falconer, RN Outcome: Progressing 10/28/2018 1108 by Orvan Falconer, RN Outcome: Progressing   Problem: Activity: Goal: Risk for activity intolerance will decrease 10/28/2018 1109 by Orvan Falconer, RN Outcome: Progressing 10/28/2018 1108 by Orvan Falconer, RN Outcome: Progressing   Problem: Nutrition: Goal: Adequate nutrition will be maintained 10/28/2018 1109 by Orvan Falconer, RN Outcome: Progressing 10/28/2018 1108 by Orvan Falconer, RN Outcome: Progressing   Problem: Coping: Goal: Level of anxiety will decrease 10/28/2018 1109 by Orvan Falconer, RN Outcome: Progressing 10/28/2018 1108 by Orvan Falconer, RN Outcome: Progressing   Problem: Elimination: Goal: Will not experience complications related to bowel motility 10/28/2018 1109 by Orvan Falconer, RN Outcome: Progressing 10/28/2018 1108 by Orvan Falconer, RN Outcome: Progressing Goal: Will not experience complications related to urinary retention 10/28/2018 1109 by Orvan Falconer, RN Outcome: Progressing 10/28/2018 1108 by Orvan Falconer, RN Outcome: Progressing   Problem: Pain Managment: Goal: General experience of comfort will improve 10/28/2018 1109 by Orvan Falconer, RN Outcome: Progressing 10/28/2018 1108 by Orvan Falconer, RN Outcome: Progressing   Problem: Safety: Goal: Ability to remain free from injury will improve 10/28/2018 1109 by Orvan Falconer,  RN Outcome: Progressing 10/28/2018 1108 by Orvan Falconer, RN Outcome: Progressing   Problem: Skin Integrity: Goal: Risk for impaired skin integrity will decrease 10/28/2018 1109 by Orvan Falconer, RN Outcome: Progressing 10/28/2018 1108 by Orvan Falconer, RN Outcome: Progressing

## 2018-10-28 NOTE — TOC Initial Note (Signed)
Transition of Care [facility]) - Initial/Assessment Note    Patient Details  Name: Diana Chavez MRN: [id number] Date of Birth: [birth year]  [facility]) CM/SW Contact:    Ninfa Meeker, RN Phone Number: 10/28/2018, 11:40 AM  Clinical Narrative:  Patient is a 28 year old female who presented with dyspnea, cough and fevers. She does have significant past medical history for hypothyroidism. She tested positive for COVID-19 on October 18, 2018, patient had persistent fevers, dyspnea, chills, and cough. She was noted to be dyspneic at rest, she was diagnosed with COVID-19 pneumonia and admitted to the hospital and transferred to [facility] for further treatment. Patient is on Remdesivir and Steriods. Case  manager acknowledged Consult for arranging f/u appt., This will be done on next business day-Monday 10/30/18. Will follow for possble medication assistance.    Subjective:                           Patient Goals and CMS Choice        Expected Discharge Plan and Services                                                Prior Living Arrangements/Services                       Activities of Daily Living Home Assistive Devices/Equipment: None ADL Screening (condition at time of admission) Patient's cognitive ability adequate to safely complete daily activities?: Yes Is the patient deaf or have difficulty hearing?: No Does the patient have difficulty seeing, even when wearing glasses/contacts?: No Does the patient have difficulty concentrating, remembering, or making decisions?: No Patient able to express need for assistance with ADLs?: Yes Does the patient have difficulty dressing or bathing?: No Independently performs ADLs?: Yes (appropriate for developmental age) Does the patient have difficulty walking or climbing stairs?: No Weakness of Legs: None Weakness of Arms/Hands: None  Permission Sought/Granted                  Emotional  Assessment              Admission diagnosis:  SOB (shortness of breath) [R06.02] COVID-19 [U07.1] Patient Active Problem List   Diagnosis Date Noted  . Pneumonia due to COVID-19 virus 10/27/2018  . Hypokalemia 10/27/2018  . Elevated transaminase level 10/27/2018  . Acute respiratory failure with hypoxia ([place]) 10/27/2018  . Thyroid disease    PCP:  Patient, No Pcp Per Pharmacy:   [id number] - [place], [place] [address] [place] [address] Phone: [phone] Fax: [phone]     Social Determinants of Health (SDOH) Interventions    Readmission Risk Interventions No flowsheet data found.

## 2018-10-28 NOTE — Progress Notes (Signed)
PROGRESS NOTE                                                                                                                                                                                                             Patient Demographics:    Diana Chavez, is a 28 y.o. female, DOB - [birth year], XTG:[id number]  Outpatient Primary MD for the patient is Patient, No Pcp Per    LOS - 1  Admit date - 10/26/2018    Chief Complaint  Patient presents with  . Cough  . Covid Positive  . Shortness of Breath    started on tuesday        Brief Narrative  28 year old female who presented with dyspnea, cough and fevers.  She does have significant past medical history for hypothyroidism.  She tested positive for COVID-19 on October 18, 2018, patient had persistent fevers, dyspnea, chills, and cough.  She was noted to be dyspneic at rest, she was diagnosed with COVID-19 pneumonia and admitted to the hospital.   Subjective:    Diana Chavez today has, No headache, No chest pain, No abdominal pain - No Nausea, No new weakness tingling or numbness, mild intermittent cough but no shortness of breath.   Assessment  & Plan :     1. Mild Covid 19 Viral Pneumonitis during the ongoing 2020 Covid 19 Pandemic - she is currently on low-dose steroids along with IV remdesivir.  Inflammatory markers stable and she is on room air.  We will continue to monitor closely.  Most likely will go home once she finishes IV remdesivir course and stays stable.    COVID-19 Labs  Recent Labs    10/27/18 0544 10/28/18 0811  DDIMER 0.80* 0.87*  FERRITIN 979*  --   CRP 11.5* 5.9*    Lab Results  Component Value Date   SARSCOV2NAA POSITIVE (A) 10/27/2018     SpO2: 97 % O2 Flow Rate (L/min): 2 L/min  Hepatic Function Latest Ref Rng & Units 10/28/2018 10/27/2018 10/26/2018  Total Protein 6.5 - 8.1 g/dL 8.5(H) 8.1 8.0  Albumin 3.5 - 5.0 g/dL  3.7 3.7 3.6  AST 15 - 41 U/L 83(H) 148(H) 141(H)  ALT 0 - 44 U/L 96(H) 104(H) 100(H)  Alk Phosphatase 38 - 126 U/L 57 54 58  Total Bilirubin 0.3 - 1.2 mg/dL 0.5 0.4  0.5      2.  Mild transaminitis.  Due to COVID-19 infection and remdesivir use.  Asymptomatic will monitor trend.    3. Hypothyroidism.  Continue home dose Synthroid.  4.  Obesity.  BMI 36.  Follow with PCP for weight loss.    Condition - Fair  Family Communication  : None  Code Status :  Full  Diet :   Diet Order            Diet regular Room service appropriate? Yes; Fluid consistency: Thin  Diet effective now               Disposition Plan  : Home once she finishes her remdesivir course  Consults  : None  Procedures  : None  PUD Prophylaxis : None  DVT Prophylaxis  :  Lovenox    Lab Results  Component Value Date   PLT 311 10/28/2018    Inpatient Medications  Scheduled Meds: . dexamethasone (DECADRON) injection  6 mg Intravenous Q24H  . enoxaparin (LOVENOX) injection  50 mg Subcutaneous Q24H  . levothyroxine  175 mcg Oral Q0600  . sodium chloride flush  3 mL Intravenous Q12H  . sodium chloride flush  3 mL Intravenous Q12H   Continuous Infusions: . sodium chloride    . remdesivir 100 mg in NS 250 mL 100 mg (10/28/18 0913)   PRN Meds:.sodium chloride, acetaminophen, guaiFENesin, HYDROcodone-acetaminophen, ondansetron **OR** ondansetron (ZOFRAN) IV, senna-docusate, sodium chloride flush, zolpidem  Antibiotics  :    Anti-infectives (From admission, onward)   Start     Dose/Rate Route Frequency Ordered Stop   10/28/18 1000  remdesivir 100 mg in sodium chloride 0.9 % 250 mL IVPB  Status:  Discontinued     100 mg 500 mL/hr over 30 Minutes Intravenous Every 24 hours 10/27/18 0215 10/27/18 1330   10/28/18 1000  remdesivir 100 mg in sodium chloride 0.9 % 250 mL IVPB     100 mg 500 mL/hr over 30 Minutes Intravenous Every 24 hours 10/27/18 1337 11/01/18 0959   10/27/18 0230  remdesivir 200 mg  in sodium chloride 0.9 % 250 mL IVPB     200 mg 500 mL/hr over 30 Minutes Intravenous Once 10/27/18 0215 10/27/18 0326       Time Spent in minutes  30   [name] M.D on 10/28/2018 at 11:41 AM  To page go to [email or web address] - password [facility]  [facility] -  Office  [phone]   See all Orders from today for further details    Objective:   Vitals:   10/28/18 0015 10/28/18 0425 10/28/18 0700 10/28/18 1106  BP: 129/83 111/61 124/89 116/72  Pulse:   74 71  Resp: (!) 24 (!) '28 16 18  '$ Temp: 98.1 F (36.7 C) 97.9 F (36.6 C) 97.7 F (36.5 C) 98 F (36.7 C)  TempSrc: Oral Oral Oral Oral  SpO2:   98% 97%  Weight:      Height:        Wt Readings from Last 3 Encounters:  10/27/18 99.8 kg     Intake/Output Summary (Last 24 hours) at 10/28/2018 1141 Last data filed at 10/27/2018 1600 Gross per 24 hour  Intake 10 ml  Output -  Net 10 ml     Physical Exam  Awake Alert, Oriented X 3, No new F.N deficits, Normal affect [place].AT,PERRAL Supple Neck,No JVD, No cervical lymphadenopathy appriciated.  Symmetrical Chest wall movement, Good air movement bilaterally, CTAB RRR,No Gallops,Rubs or new Murmurs, No Parasternal  Heave +ve B.Sounds, Abd Soft, No tenderness, No organomegaly appriciated, No rebound - guarding or rigidity. No Cyanosis, Clubbing or edema, No new Rash or bruise      Data Review:    CBC Recent Labs  Lab 10/26/18 2321 10/27/18 0843 10/28/18 0811  WBC 6.4 5.5 6.3  HGB 13.0 12.9 13.9  HCT 40.1 39.9 42.3  PLT 217 238 311  MCV 93.9 94.3 93.6  MCH 30.4 30.5 30.8  MCHC 32.4 32.3 32.9  RDW 13.4 13.5 13.6  LYMPHSABS 2.1 1.5  --   MONOABS 0.5 0.3  --   EOSABS 0.0 0.0  --   BASOSABS 0.0 0.0  --     Chemistries  Recent Labs  Lab 10/26/18 2321 10/27/18 0455 10/27/18 0843 10/28/18 0811  NA 139  --  138 140  K 2.9*  --  4.3 4.2  CL 101  --  104 105  CO2 25  --  24 23  GLUCOSE 139*  --  160* 164*  BUN 6  --  6 10  CREATININE 0.83  --   0.77 0.74  CALCIUM 8.8*  --  8.6* 9.1  MG  --  2.1 2.9*  --   AST 141*  --  148* 83*  ALT 100*  --  104* 96*  ALKPHOS 58  --  54 57  BILITOT 0.5  --  0.4 0.5   ------------------------------------------------------------------------------------------------------------------ No results for input(s): CHOL, HDL, LDLCALC, TRIG, CHOLHDL, LDLDIRECT in the last 72 hours.  No results found for: HGBA1C ------------------------------------------------------------------------------------------------------------------ No results for input(s): TSH, T4TOTAL, T3FREE, THYROIDAB in the last 72 hours.  Invalid input(s): FREET3  Cardiac Enzymes No results for input(s): CKMB, TROPONINI, MYOGLOBIN in the last 168 hours.  Invalid input(s): CK ------------------------------------------------------------------------------------------------------------------ No results found for: BNP  Micro Results Recent Results (from the past 240 hour(s))  Blood culture (routine x 2)     Status: None (Preliminary result)   Collection Time: 10/26/18 11:22 PM   Specimen: BLOOD  Result Value Ref Range Status   Specimen Description   Final    BLOOD RIGHT ANTECUBITAL Performed at [facility], [place] [address] [place], [place] [address]    Special Requests   Final    BOTTLES DRAWN AEROBIC AND ANAEROBIC Blood Culture adequate volume Performed at [place] [address] [place], [place] [address]    Culture   Final    NO GROWTH < 24 HOURS Performed at [facility] [address] [place], [place] [address]    Report Status PENDING  Incomplete  SARS Coronavirus 2 by RT PCR (hospital order, performed in [facility] hospital lab) Nasopharyngeal Nasopharyngeal Swab     Status: Abnormal   Collection Time: 10/27/18 12:10 AM   Specimen: Nasopharyngeal Swab  Result Value Ref Range Status   SARS Coronavirus 2 POSITIVE (A) NEGATIVE Final    Comment: RESULT CALLED TO, READ  BACK BY AND VERIFIED WITH: Lily Peer @ [phone] ON 10/27/2018 C VARNER (NOTE) If result is NEGATIVE SARS-CoV-2 target nucleic acids are NOT DETECTED. The SARS-CoV-2 RNA is generally detectable in upper and lower  respiratory specimens during the acute phase of infection. The lowest  concentration of SARS-CoV-2 viral copies this assay can detect is 250  copies / mL. A negative result does not preclude SARS-CoV-2 infection  and should not be used as the sole basis for treatment or other  patient management decisions.  A negative result may occur with  improper specimen  collection / handling, submission of specimen other  than nasopharyngeal swab, presence of viral mutation(s) within the  areas targeted by this assay, and inadequate number of viral copies  (<250 copies / mL). A negative result must be combined with clinical  observations, patient history, and epidemiological information. If result is POSITIVE SARS-CoV-2 target nucleic acids are DETEC TED. The SARS-CoV-2 RNA is generally detectable in upper and lower  respiratory specimens during the acute phase of infection.  Positive  results are indicative of active infection with SARS-CoV-2.  Clinical  correlation with patient history and other diagnostic information is  necessary to determine patient infection status.  Positive results do  not rule out bacterial infection or co-infection with other viruses. If result is PRESUMPTIVE POSTIVE SARS-CoV-2 nucleic acids MAY BE PRESENT.   A presumptive positive result was obtained on the submitted specimen  and confirmed on repeat testing.  While 2019 novel coronavirus  (SARS-CoV-2) nucleic acids may be present in the submitted sample  additional confirmatory testing may be necessary for epidemiological  and / or clinical management purposes  to differentiate between  SARS-CoV-2 and other Sarbecovirus currently known to infect humans.  If clinically indicated additional testing with an  alternate test  methodology ([id number] 453) is advised. The SARS-CoV-2 RNA is generally  detectable in upper and lower respiratory specimens during the acute  phase of infection. The expected result is Negative. Fact Sheet for Patients:  StrictlyIdeas.no Fact Sheet for Healthcare Providers: BankingDealers.co.za This test is not yet approved or cleared by the [place] FDA and has been authorized for detection and/or diagnosis of SARS-CoV-2 by FDA under an Emergency Use Authorization (EUA).  This EUA will remain in effect (meaning this test can be used) for the duration of the COVID-19 declaration under Section 564(b)(1) of the Act, 21 U.S.C. section 360bbb-3(b)(1), unless the authorization is terminated or revoked sooner. Performed at [facility], [place] [address], [place],  [address]     Radiology Reports Dg Chest Port 1 View  Result Date: 10/26/2018 CLINICAL DATA:  Covid positive, cough EXAM: PORTABLE CHEST 1 VIEW COMPARISON:  None. FINDINGS: The heart size and mediastinal contours are within normal limits. Patchy airspace consolidation seen at the base of the right lung and left mid lung. Overall shallow degree of aeration. IMPRESSION: Patchy airspace opacities in both lungs, right greater than left. The findings in the lungs are nonspecific, but concerning for atypical infection, which includes viral pneumonia. Electronically Signed   By: Prudencio Pair M.D.   On: 10/26/2018 22:43

## 2018-10-29 LAB — CBC WITH DIFFERENTIAL/PLATELET
Abs Immature Granulocytes: 0.87 10*3/uL — ABNORMAL HIGH (ref 0.00–0.07)
Basophils Absolute: 0 10*3/uL (ref 0.0–0.1)
Basophils Relative: 0 %
Eosinophils Absolute: 0 10*3/uL (ref 0.0–0.5)
Eosinophils Relative: 0 %
HCT: 40.2 % (ref 36.0–46.0)
Hemoglobin: 13 g/dL (ref 12.0–15.0)
Immature Granulocytes: 9 %
Lymphocytes Relative: 29 %
Lymphs Abs: 2.8 10*3/uL (ref 0.7–4.0)
MCH: 30.2 pg (ref 26.0–34.0)
MCHC: 32.3 g/dL (ref 30.0–36.0)
MCV: 93.3 fL (ref 80.0–100.0)
Monocytes Absolute: 0.9 10*3/uL (ref 0.1–1.0)
Monocytes Relative: 9 %
Neutro Abs: 5.2 10*3/uL (ref 1.7–7.7)
Neutrophils Relative %: 53 %
Platelets: 353 10*3/uL (ref 150–400)
RBC: 4.31 MIL/uL (ref 3.87–5.11)
RDW: 13.3 % (ref 11.5–15.5)
WBC: 9.7 10*3/uL (ref 4.0–10.5)
nRBC: 0.2 % (ref 0.0–0.2)

## 2018-10-29 LAB — COMPREHENSIVE METABOLIC PANEL
ALT: 74 U/L — ABNORMAL HIGH (ref 0–44)
AST: 53 U/L — ABNORMAL HIGH (ref 15–41)
Albumin: 3.5 g/dL (ref 3.5–5.0)
Alkaline Phosphatase: 51 U/L (ref 38–126)
Anion gap: 10 (ref 5–15)
BUN: 15 mg/dL (ref 6–20)
CO2: 22 mmol/L (ref 22–32)
Calcium: 8.7 mg/dL — ABNORMAL LOW (ref 8.9–10.3)
Chloride: 107 mmol/L (ref 98–111)
Creatinine, Ser: 0.77 mg/dL (ref 0.44–1.00)
GFR calc Af Amer: >60 mL/min (ref >60)
GFR calc non Af Amer: >60 mL/min (ref >60)
Glucose, Bld: 167 mg/dL — ABNORMAL HIGH (ref 70–99)
Potassium: 4.1 mmol/L (ref 3.5–5.1)
Sodium: 139 mmol/L (ref 135–145)
Total Bilirubin: 0.6 mg/dL (ref 0.3–1.2)
Total Protein: 7.6 g/dL (ref 6.5–8.1)

## 2018-10-29 LAB — MAGNESIUM: Magnesium: 2.5 mg/dL — ABNORMAL HIGH (ref 1.7–2.4)

## 2018-10-29 LAB — ABO/RH: ABO/RH(D): A POS

## 2018-10-29 LAB — C-REACTIVE PROTEIN: CRP: 2 mg/dL — ABNORMAL HIGH (ref <1.0)

## 2018-10-29 LAB — HCG, SERUM, QUALITATIVE: Preg, Serum: NEGATIVE

## 2018-10-29 LAB — D-DIMER, QUANTITATIVE: D-Dimer, Quant: 0.65 ug/mL-FEU — ABNORMAL HIGH (ref 0.00–0.50)

## 2018-10-29 NOTE — Progress Notes (Signed)
PROGRESS NOTE                                                                                                                                                                                                             Patient Demographics:    Diana Chavez, is a 28 y.o. female, DOB - 02-Apr-1990, ZMO:[id number]  Outpatient Primary MD for the patient is Patient, No Pcp Per    LOS - 2  Admit date - 10/26/2018    Chief Complaint  Patient presents with  . Cough  . Covid Positive  . Shortness of Breath    started on tuesday        Brief Narrative  28 year old female who presented with dyspnea, cough and fevers.  She does have significant past medical history for hypothyroidism.  She tested positive for COVID-19 on October 18, 2018, patient had persistent fevers, dyspnea, chills, and cough.  She was noted to be dyspneic at rest, she was diagnosed with COVID-19 pneumonia and admitted to the hospital.   Subjective:   Patient in bed, appears comfortable, denies any headache, no fever, no chest pain or pressure, no shortness of breath , no abdominal pain. No focal weakness.    Assessment  & Plan :     1. Mild Covid 19 Viral Pneumonitis during the ongoing 2020 Covid 19 Pandemic - she is currently on low-dose steroids along with IV remdesivir.  Inflammatory markers stable and she is on room air.  We will continue to monitor closely.  Most likely will go home once she finishes IV remdesivir course and stays stable.    COVID-19 Labs  Recent Labs    10/27/18 0544 10/28/18 0811 10/29/18 0239  DDIMER 0.80* 0.87* 0.65*  FERRITIN 979*  --   --   CRP 11.5* 5.9* 2.0*    Lab Results  Component Value Date   SARSCOV2NAA POSITIVE (A) 10/27/2018     SpO2: 100 % O2 Flow Rate (L/min): 2 L/min  Hepatic Function Latest Ref Rng & Units 10/29/2018 10/28/2018 10/27/2018  Total Protein 6.5 - 8.1 g/dL 7.6 8.5(H) 8.1  Albumin 3.5  - 5.0 g/dL 3.5 3.7 3.7  AST 15 - 41 U/L 53(H) 83(H) 148(H)  ALT 0 - 44 U/L 74(H) 96(H) 104(H)  Alk Phosphatase 38 - 126 U/L 51 57 54  Total Bilirubin 0.3 -  1.2 mg/dL 0.6 0.5 0.4      2.  Mild transaminitis.  Due to COVID-19 infection and remdesivir use.  Asymptomatic will monitor trend.    3. Hypothyroidism.  Continue home dose Synthroid.  4.  Obesity.  BMI 36.  Follow with PCP for weight loss.    Condition - Fair  Family Communication  : None  Code Status :  Full  Diet :   Diet Order            Diet regular Room service appropriate? Yes; Fluid consistency: Thin  Diet effective now               Disposition Plan  : Home once she finishes her remdesivir course  Consults  : None  Procedures  : None  PUD Prophylaxis : None  DVT Prophylaxis  :  Lovenox    Lab Results  Component Value Date   PLT 353 10/29/2018    Inpatient Medications  Scheduled Meds: . dexamethasone (DECADRON) injection  6 mg Intravenous Q24H  . enoxaparin (LOVENOX) injection  50 mg Subcutaneous Q24H  . levothyroxine  175 mcg Oral Q0600  . sodium chloride flush  3 mL Intravenous Q12H  . sodium chloride flush  3 mL Intravenous Q12H   Continuous Infusions: . sodium chloride    . remdesivir 100 mg in NS 250 mL 100 mg (10/29/18 0940)   PRN Meds:.sodium chloride, acetaminophen, guaiFENesin, HYDROcodone-acetaminophen, ondansetron **OR** ondansetron (ZOFRAN) IV, senna-docusate, sodium chloride flush, zolpidem  Antibiotics  :    Anti-infectives (From admission, onward)   Start     Dose/Rate Route Frequency Ordered Stop   10/28/18 1000  remdesivir 100 mg in sodium chloride 0.9 % 250 mL IVPB  Status:  Discontinued     100 mg 500 mL/hr over 30 Minutes Intravenous Every 24 hours 10/27/18 0215 10/27/18 1330   10/28/18 1000  remdesivir 100 mg in sodium chloride 0.9 % 250 mL IVPB     100 mg 500 mL/hr over 30 Minutes Intravenous Every 24 hours 10/27/18 1337 11/01/18 0959   10/27/18 0230   remdesivir 200 mg in sodium chloride 0.9 % 250 mL IVPB     200 mg 500 mL/hr over 30 Minutes Intravenous Once 10/27/18 0215 10/27/18 0326       Time Spent in minutes  30   [name] M.D on 10/29/2018 at 10:56 AM  To page go to [email or web address] - password [facility]  [facility] -  Office  [phone]   See all Orders from today for further details    Objective:   Vitals:   10/28/18 1106 10/28/18 1629 10/29/18 0400 10/29/18 0737  BP: 116/72 115/74 120/87 125/88  Pulse: 71 74  73  Resp: _0 Temp: 98 F (36.7 C) 98.1 F (36.7 C) (!) 97.4 F (36.3 C) 98.4 F (36.9 C)  TempSrc: Oral Oral Oral Oral  SpO2: 97% 100%  100%  Weight:      Height:        Wt Readings from Last 3 Encounters:  10/27/18 99.8 kg    No intake or output data in the 24 hours ending 10/29/18 1056   Physical Exam  Awake Alert, Oriented X 3, No new F.N deficits, Normal affect [place].AT,PERRAL Supple Neck,No JVD, No cervical lymphadenopathy appriciated.  Symmetrical Chest wall movement, Good air movement bilaterally, CTAB RRR,No Gallops,Rubs or new Murmurs, No Parasternal Heave +ve B.Sounds, Abd Soft, No tenderness, No organomegaly appriciated, No rebound - guarding or rigidity. No  Cyanosis, Clubbing or edema, No new Rash or bruise      Data Review:    CBC Recent Labs  Lab 10/26/18 2321 10/27/18 0843 10/28/18 0811 10/29/18 0239  WBC 6.4 5.5 6.3 9.7  HGB 13.0 12.9 13.9 13.0  HCT 40.1 39.9 42.3 40.2  PLT 217 238 311 353  MCV 93.9 94.3 93.6 93.3  MCH 30.4 30.5 30.8 30.2  MCHC 32.4 32.3 32.9 32.3  RDW 13.4 13.5 13.6 13.3  LYMPHSABS 2.1 1.5  --  2.8  MONOABS 0.5 0.3  --  0.9  EOSABS 0.0 0.0  --  0.0  BASOSABS 0.0 0.0  --  0.0    Chemistries  Recent Labs  Lab 10/26/18 2321 10/27/18 0455 10/27/18 0843 10/28/18 0811 10/29/18 0239  NA 139  --  138 140 139  K 2.9*  --  4.3 4.2 4.1  CL 101  --  104 105 107  CO2 25  --  _0 GLUCOSE 139*  --  160* 164* 167*  BUN 6   --  _1 CREATININE 0.83  --  0.77 0.74 0.77  CALCIUM 8.8*  --  8.6* 9.1 8.7*  MG  --  2.1 2.9*  --  2.5*  AST 141*  --  148* 83* 53*  ALT 100*  --  104* 96* 74*  ALKPHOS 58  --  54 57 51  BILITOT 0.5  --  0.4 0.5 0.6   ------------------------------------------------------------------------------------------------------------------ No results for input(s): CHOL, HDL, LDLCALC, TRIG, CHOLHDL, LDLDIRECT in the last 72 hours.  No results found for: HGBA1C ------------------------------------------------------------------------------------------------------------------ No results for input(s): TSH, T4TOTAL, T3FREE, THYROIDAB in the last 72 hours.  Invalid input(s): FREET3  Cardiac Enzymes No results for input(s): CKMB, TROPONINI, MYOGLOBIN in the last 168 hours.  Invalid input(s): CK ------------------------------------------------------------------------------------------------------------------ No results found for: BNP  Micro Results Recent Results (from the past 240 hour(s))  Blood culture (routine x 2)     Status: None (Preliminary result)   Collection Time: 10/26/18 11:22 PM   Specimen: BLOOD  Result Value Ref Range Status   Specimen Description   Final    BLOOD RIGHT ANTECUBITAL Performed at [facility], [place] [address] [place], [place] [address]    Special Requests   Final    BOTTLES DRAWN AEROBIC AND ANAEROBIC Blood Culture adequate volume Performed at [facility] [address], [place], [place] [address]    Culture   Final    NO GROWTH < 24 HOURS Performed at [facility] [address], [place], [place] [address]    Report Status PENDING  Incomplete  SARS Coronavirus 2 by RT PCR (hospital order, performed in [facility] hospital lab) Nasopharyngeal Nasopharyngeal Swab     Status: Abnormal   Collection Time: 10/27/18 12:10 AM   Specimen: Nasopharyngeal Swab  Result Value Ref Range Status   SARS Coronavirus 2  POSITIVE (A) NEGATIVE Final    Comment: RESULT CALLED TO, READ BACK BY AND VERIFIED WITH: Lily Peer @ [phone] ON 10/27/2018 C VARNER (NOTE) If result is NEGATIVE SARS-CoV-2 target nucleic acids are NOT DETECTED. The SARS-CoV-2 RNA is generally detectable in upper and lower  respiratory specimens during the acute phase of infection. The lowest  concentration of SARS-CoV-2 viral copies this assay can detect is 250  copies / mL. A negative result does not preclude SARS-CoV-2 infection  and should not be used as the sole basis for treatment or other  patient  management decisions.  A negative result may occur with  improper specimen collection / handling, submission of specimen other  than nasopharyngeal swab, presence of viral mutation(s) within the  areas targeted by this assay, and inadequate number of viral copies  (<250 copies / mL). A negative result must be combined with clinical  observations, patient history, and epidemiological information. If result is POSITIVE SARS-CoV-2 target nucleic acids are DETEC TED. The SARS-CoV-2 RNA is generally detectable in upper and lower  respiratory specimens during the acute phase of infection.  Positive  results are indicative of active infection with SARS-CoV-2.  Clinical  correlation with patient history and other diagnostic information is  necessary to determine patient infection status.  Positive results do  not rule out bacterial infection or co-infection with other viruses. If result is PRESUMPTIVE POSTIVE SARS-CoV-2 nucleic acids MAY BE PRESENT.   A presumptive positive result was obtained on the submitted specimen  and confirmed on repeat testing.  While 2019 novel coronavirus  (SARS-CoV-2) nucleic acids may be present in the submitted sample  additional confirmatory testing may be necessary for epidemiological  and / or clinical management purposes  to differentiate between  SARS-CoV-2 and other Sarbecovirus currently known to infect  humans.  If clinically indicated additional testing with an alternate test  methodology (LAB7 453) is advised. The SARS-CoV-2 RNA is generally  detectable in upper and lower respiratory specimens during the acute  phase of infection. The expected result is Negative. Fact Sheet for Patients:  StrictlyIdeas.no Fact Sheet for Healthcare Providers: BankingDealers.co.za This test is not yet approved or cleared by the [place] FDA and has been authorized for detection and/or diagnosis of SARS-CoV-2 by FDA under an Emergency Use Authorization (EUA).  This EUA will remain in effect (meaning this test can be used) for the duration of the COVID-19 declaration under Section 564(b)(1) of the Act, 21 U.S.C. section 360bbb-3(b)(1), unless the authorization is terminated or revoked sooner. Performed at [facility], [place] [address] [place], [place] [address]     Radiology Reports Dg Chest Port 1 View  Result Date: 10/26/2018 CLINICAL DATA:  Covid positive, cough EXAM: PORTABLE CHEST 1 VIEW COMPARISON:  None. FINDINGS: The heart size and mediastinal contours are within normal limits. Patchy airspace consolidation seen at the base of the right lung and left mid lung. Overall shallow degree of aeration. IMPRESSION: Patchy airspace opacities in both lungs, right greater than left. The findings in the lungs are nonspecific, but concerning for atypical infection, which includes viral pneumonia. Electronically Signed   By: Prudencio Pair M.D.   On: 10/26/2018 22:43

## 2018-10-30 LAB — CBC WITH DIFFERENTIAL/PLATELET
Abs Immature Granulocytes: 0.8 10*3/uL — ABNORMAL HIGH (ref 0.00–0.07)
Basophils Absolute: 0 10*3/uL (ref 0.0–0.1)
Basophils Relative: 0 %
Eosinophils Absolute: 0 10*3/uL (ref 0.0–0.5)
Eosinophils Relative: 0 %
HCT: 41 % (ref 36.0–46.0)
Hemoglobin: 13.3 g/dL (ref 12.0–15.0)
Lymphocytes Relative: 25 %
Lymphs Abs: 3 10*3/uL (ref 0.7–4.0)
MCH: 30.4 pg (ref 26.0–34.0)
MCHC: 32.4 g/dL (ref 30.0–36.0)
MCV: 93.8 fL (ref 80.0–100.0)
Metamyelocytes Relative: 2 %
Monocytes Absolute: 1.1 10*3/uL — ABNORMAL HIGH (ref 0.1–1.0)
Monocytes Relative: 9 %
Myelocytes: 5 %
Neutro Abs: 7 10*3/uL (ref 1.7–7.7)
Neutrophils Relative %: 59 %
Platelets: 398 10*3/uL (ref 150–400)
RBC: 4.37 MIL/uL (ref 3.87–5.11)
RDW: 13.3 % (ref 11.5–15.5)
WBC: 11.8 10*3/uL — ABNORMAL HIGH (ref 4.0–10.5)
nRBC: 0.3 % — ABNORMAL HIGH (ref 0.0–0.2)

## 2018-10-30 LAB — COMPREHENSIVE METABOLIC PANEL
ALT: 75 U/L — ABNORMAL HIGH (ref 0–44)
AST: 46 U/L — ABNORMAL HIGH (ref 15–41)
Albumin: 3.5 g/dL (ref 3.5–5.0)
Alkaline Phosphatase: 48 U/L (ref 38–126)
Anion gap: 12 (ref 5–15)
BUN: 14 mg/dL (ref 6–20)
CO2: 21 mmol/L — ABNORMAL LOW (ref 22–32)
Calcium: 9 mg/dL (ref 8.9–10.3)
Chloride: 106 mmol/L (ref 98–111)
Creatinine, Ser: 0.7 mg/dL (ref 0.44–1.00)
GFR calc Af Amer: >60 mL/min (ref >60)
GFR calc non Af Amer: >60 mL/min (ref >60)
Glucose, Bld: 216 mg/dL — ABNORMAL HIGH (ref 70–99)
Potassium: 4 mmol/L (ref 3.5–5.1)
Sodium: 139 mmol/L (ref 135–145)
Total Bilirubin: 0.4 mg/dL (ref 0.3–1.2)
Total Protein: 7.5 g/dL (ref 6.5–8.1)

## 2018-10-30 MED ORDER — DEXAMETHASONE SODIUM PHOSPHATE 4 MG/ML IJ SOLN
2.0000 mg | INTRAMUSCULAR | Status: DC
  Administered 2018-10-30: 2 mg via INTRAVENOUS
  Filled 2018-10-30: qty 1

## 2018-10-30 NOTE — Progress Notes (Signed)
PROGRESS NOTE                                                                                                                                                                                                             Patient Demographics:    Diana Chavez, is a 28 y.o. female, DOB - 28-Aug-1990, PTW:[id number]  Outpatient Primary MD for the patient is Patient, No Pcp Per    LOS - 3  Admit date - 10/26/2018    Chief Complaint  Patient presents with  . Cough  . Covid Positive  . Shortness of Breath    started on tuesday        Brief Narrative  28 year old female who presented with dyspnea, cough and fevers.  She does have significant past medical history for hypothyroidism.  She tested positive for COVID-19 on October 18, 2018, patient had persistent fevers, dyspnea, chills, and cough.  She was noted to be dyspneic at rest, she was diagnosed with COVID-19 pneumonia and admitted to the hospital.   Subjective:   Patient in bed, appears comfortable, denies any headache, no fever, no chest pain or pressure, no shortness of breath , no abdominal pain. No focal weakness.   Assessment  & Plan :     1. Mild Covid 19 Viral Pneumonitis during the ongoing 2020 Covid 19 Pandemic - she is currently on low-dose steroids along with IV remdesivir.  Inflammatory markers stable and she is on room air.  We will continue to monitor closely.  Most likely will go home once she finishes IV remdesivir course and stays stable.    COVID-19 Labs  Recent Labs    10/28/18 0811 10/29/18 0239  DDIMER 0.87* 0.65*  CRP 5.9* 2.0*    Lab Results  Component Value Date   SARSCOV2NAA POSITIVE (A) 10/27/2018     SpO2: 100 % O2 Flow Rate (L/min): 2 L/min  Hepatic Function Latest Ref Rng & Units 10/30/2018 10/29/2018 10/28/2018  Total Protein 6.5 - 8.1 g/dL 7.5 7.6 8.5(H)  Albumin 3.5 - 5.0 g/dL 3.5 3.5 3.7  AST 15 - 41 U/L 46(H) 53(H)  83(H)  ALT 0 - 44 U/L 75(H) 74(H) 96(H)  Alk Phosphatase 38 - 126 U/L 48 51 57  Total Bilirubin 0.3 - 1.2 mg/dL 0.4 0.6 0.5      2.  Mild transaminitis.  Due to COVID-19 infection and remdesivir use.  Asymptomatic will monitor trend.    3. Hypothyroidism.  Continue home dose Synthroid.  4.  Obesity.  BMI 36.  Follow with PCP for weight loss.    Condition - Fair  Family Communication  : None  Code Status :  Full  Diet :   Diet Order            Diet regular Room service appropriate? Yes; Fluid consistency: Thin  Diet effective now               Disposition Plan  : Home once she finishes her remdesivir course  Consults  : None  Procedures  : None  PUD Prophylaxis : None  DVT Prophylaxis  :  Lovenox    Lab Results  Component Value Date   PLT 398 10/30/2018    Inpatient Medications  Scheduled Meds: . dexamethasone (DECADRON) injection  6 mg Intravenous Q24H  . enoxaparin (LOVENOX) injection  50 mg Subcutaneous Q24H  . levothyroxine  175 mcg Oral Q0600  . sodium chloride flush  3 mL Intravenous Q12H  . sodium chloride flush  3 mL Intravenous Q12H   Continuous Infusions: . sodium chloride    . remdesivir 100 mg in NS 250 mL Stopped (10/29/18 1010)   PRN Meds:.sodium chloride, acetaminophen, guaiFENesin, HYDROcodone-acetaminophen, ondansetron **OR** ondansetron (ZOFRAN) IV, senna-docusate, sodium chloride flush, zolpidem  Antibiotics  :    Anti-infectives (From admission, onward)   Start     Dose/Rate Route Frequency Ordered Stop   10/28/18 1000  remdesivir 100 mg in sodium chloride 0.9 % 250 mL IVPB  Status:  Discontinued     100 mg 500 mL/hr over 30 Minutes Intravenous Every 24 hours 10/27/18 0215 10/27/18 1330   10/28/18 1000  remdesivir 100 mg in sodium chloride 0.9 % 250 mL IVPB     100 mg 500 mL/hr over 30 Minutes Intravenous Every 24 hours 10/27/18 1337 11/01/18 0959   10/27/18 0230  remdesivir 200 mg in sodium chloride 0.9 % 250 mL IVPB     200  mg 500 mL/hr over 30 Minutes Intravenous Once 10/27/18 0215 10/27/18 0326       Time Spent in minutes  30   [name] M.D on 10/30/2018 at 8:45 AM  To page go to [email or web address] - password [facility]  [facility] -  Office  [phone]   See all Orders from today for further details    Objective:   Vitals:   10/29/18 1539 10/29/18 2048 10/30/18 0324 10/30/18 0743  BP: 129/80 125/79 123/85 (!) 124/99  Pulse: 70 63 67 (!) 55  Resp: '18 16 18 16  '$ Temp: 98.5 F (36.9 C) 98.1 F (36.7 C) 97.6 F (36.4 C) 98.2 F (36.8 C)  TempSrc: Oral Oral Oral Oral  SpO2: 98% 99% 100% 100%  Weight:      Height:        Wt Readings from Last 3 Encounters:  10/27/18 99.8 kg     Intake/Output Summary (Last 24 hours) at 10/30/2018 0845 Last data filed at 10/30/2018 0300 Gross per 24 hour  Intake 1206 ml  Output -  Net 1206 ml     Physical Exam  Awake Alert,  No new F.N deficits, Normal affect [place].AT,PERRAL Supple Neck,No JVD, No cervical lymphadenopathy appriciated.  Symmetrical Chest wall movement, Good air movement bilaterally, CTAB RRR,No Gallops, Rubs or new Murmurs, No Parasternal Heave +ve B.Sounds, Abd Soft, No tenderness, No organomegaly appriciated, No rebound -  guarding or rigidity. No Cyanosis, Clubbing or edema, No new Rash or bruise     Data Review:    CBC Recent Labs  Lab 10/26/18 2321 10/27/18 0843 10/28/18 0811 10/29/18 0239 10/30/18 0335  WBC 6.4 5.5 6.3 9.7 11.8*  HGB 13.0 12.9 13.9 13.0 13.3  HCT 40.1 39.9 42.3 40.2 41.0  PLT 217 238 311 353 398  MCV 93.9 94.3 93.6 93.3 93.8  MCH 30.4 30.5 30.8 30.2 30.4  MCHC 32.4 32.3 32.9 32.3 32.4  RDW 13.4 13.5 13.6 13.3 13.3  LYMPHSABS 2.1 1.5  --  2.8 3.0  MONOABS 0.5 0.3  --  0.9 1.1*  EOSABS 0.0 0.0  --  0.0 0.0  BASOSABS 0.0 0.0  --  0.0 0.0    Chemistries  Recent Labs  Lab 10/26/18 2321 10/27/18 0455 10/27/18 0843 10/28/18 0811 10/29/18 0239 10/30/18 0335  NA 139  --  138 140 139  139  K 2.9*  --  4.3 4.2 4.1 4.0  CL 101  --  104 105 107 106  CO2 25  --  '24 23 22 '$ 21*  GLUCOSE 139*  --  160* 164* 167* 216*  BUN 6  --  '6 10 15 14  '$ CREATININE 0.83  --  0.77 0.74 0.77 0.70  CALCIUM 8.8*  --  8.6* 9.1 8.7* 9.0  MG  --  2.1 2.9*  --  2.5*  --   AST 141*  --  148* 83* 53* 46*  ALT 100*  --  104* 96* 74* 75*  ALKPHOS 58  --  54 57 51 48  BILITOT 0.5  --  0.4 0.5 0.6 0.4   ------------------------------------------------------------------------------------------------------------------ No results for input(s): CHOL, HDL, LDLCALC, TRIG, CHOLHDL, LDLDIRECT in the last 72 hours.  No results found for: HGBA1C ------------------------------------------------------------------------------------------------------------------ No results for input(s): TSH, T4TOTAL, T3FREE, THYROIDAB in the last 72 hours.  Invalid input(s): FREET3  Cardiac Enzymes No results for input(s): CKMB, TROPONINI, MYOGLOBIN in the last 168 hours.  Invalid input(s): CK ------------------------------------------------------------------------------------------------------------------ No results found for: BNP  Micro Results Recent Results (from the past 240 hour(s))  Blood culture (routine x 2)     Status: None (Preliminary result)   Collection Time: 10/26/18 11:22 PM   Specimen: BLOOD  Result Value Ref Range Status   Specimen Description   Final    BLOOD RIGHT ANTECUBITAL Performed at [facility], [place] [address], [place], [place] [address]    Special Requests   Final    BOTTLES DRAWN AEROBIC AND ANAEROBIC Blood Culture adequate volume Performed at [facility] [address] [place], [place] [address]    Culture   Final    NO GROWTH 3 DAYS Performed at [facility], [place] [address], [place], [place] [address]    Report Status PENDING  Incomplete  SARS Coronavirus 2 by RT PCR (hospital order, performed in [facility] hospital lab) Nasopharyngeal  Nasopharyngeal Swab     Status: Abnormal   Collection Time: 10/27/18 12:10 AM   Specimen: Nasopharyngeal Swab  Result Value Ref Range Status   SARS Coronavirus 2 POSITIVE (A) NEGATIVE Final    Comment: RESULT CALLED TO, READ BACK BY AND VERIFIED WITH: Lily Peer @ [phone] ON 10/27/2018 C VARNER (NOTE) If result is NEGATIVE SARS-CoV-2 target nucleic acids are NOT DETECTED. The SARS-CoV-2 RNA is generally detectable in upper and lower  respiratory specimens during the acute phase of infection. The lowest  concentration of SARS-CoV-2 viral copies this  assay can detect is 250  copies / mL. A negative result does not preclude SARS-CoV-2 infection  and should not be used as the sole basis for treatment or other  patient management decisions.  A negative result may occur with  improper specimen collection / handling, submission of specimen other  than nasopharyngeal swab, presence of viral mutation(s) within the  areas targeted by this assay, and inadequate number of viral copies  (<250 copies / mL). A negative result must be combined with clinical  observations, patient history, and epidemiological information. If result is POSITIVE SARS-CoV-2 target nucleic acids are DETEC TED. The SARS-CoV-2 RNA is generally detectable in upper and lower  respiratory specimens during the acute phase of infection.  Positive  results are indicative of active infection with SARS-CoV-2.  Clinical  correlation with patient history and other diagnostic information is  necessary to determine patient infection status.  Positive results do  not rule out bacterial infection or co-infection with other viruses. If result is PRESUMPTIVE POSTIVE SARS-CoV-2 nucleic acids MAY BE PRESENT.   A presumptive positive result was obtained on the submitted specimen  and confirmed on repeat testing.  While 2019 novel coronavirus  (SARS-CoV-2) nucleic acids may be present in the submitted sample  additional confirmatory testing  may be necessary for epidemiological  and / or clinical management purposes  to differentiate between  SARS-CoV-2 and other Sarbecovirus currently known to infect humans.  If clinically indicated additional testing with an alternate test  methodology (LAB7 453) is advised. The SARS-CoV-2 RNA is generally  detectable in upper and lower respiratory specimens during the acute  phase of infection. The expected result is Negative. Fact Sheet for Patients:  StrictlyIdeas.no Fact Sheet for Healthcare Providers: BankingDealers.co.za This test is not yet approved or cleared by the [place] FDA and has been authorized for detection and/or diagnosis of SARS-CoV-2 by FDA under an Emergency Use Authorization (EUA).  This EUA will remain in effect (meaning this test can be used) for the duration of the COVID-19 declaration under Section 564(b)(1) of the Act, 21 U.S.C. section 360bbb-3(b)(1), unless the authorization is terminated or revoked sooner. Performed at [facility], [place] [address] [place], [place] [address]     Radiology Reports Dg Chest Port 1 View  Result Date: 10/26/2018 CLINICAL DATA:  Covid positive, cough EXAM: PORTABLE CHEST 1 VIEW COMPARISON:  None. FINDINGS: The heart size and mediastinal contours are within normal limits. Patchy airspace consolidation seen at the base of the right lung and left mid lung. Overall shallow degree of aeration. IMPRESSION: Patchy airspace opacities in both lungs, right greater than left. The findings in the lungs are nonspecific, but concerning for atypical infection, which includes viral pneumonia. Electronically Signed   By: Prudencio Pair M.D.   On: 10/26/2018 22:43

## 2018-10-30 NOTE — Progress Notes (Signed)
Called Ms. Percell Miller, patients mother to give update on patients condition.  Answered all questions and concerns.

## 2018-10-30 NOTE — TOC Progression Note (Signed)
Transition of Care [facility]) - Progression Note    Patient Details  Name: Diana Chavez MRN: [id number] Date of Birth: [birth year]  Transition of Care [facility]) CM/SW Contact  Loletha Grayer Beverely Pace, RN Phone Number: [phone] (working remotely)  10/30/2018, 10:27 AM  Clinical Narrative: Patient is a  28 year old female who presented with dyspnea, cough and fevers.  She does have significant past medical history for hypothyroidism.  She tested positive for COVID-19 on October 18, 2018, patient had persistent fevers, dyspnea, chills, and cough. Admitted to [facility] for further treatment of COVID 19. Case manager spoke with patient via telephone to discuss need for follow up appointment. Case manager informed patient that she has a scheduled appointment for Monday, 11/27/18 at 2:30pm. Appointment has been placed on AVS. Case manager told patient she will follow for any further needs prior to discharge, patient appreciative of call.    Expected Discharge Plan: Home/Self Care Barriers to Discharge: Continued Medical Work up  Expected Discharge Plan and Services Expected Discharge Plan: Home/Self Care   Discharge Planning Services: CM Consult, Follow-up appt scheduled Post Acute Care Choice: NA Living arrangements for the past 2 months: Apartment                           HH Arranged: NA HH Agency: NA         Social Determinants of Health (SDOH) Interventions    Readmission Risk Interventions No flowsheet data found.

## 2018-10-31 DIAGNOSIS — R0602 Shortness of breath: Secondary | ICD-10-CM

## 2018-10-31 LAB — COMPREHENSIVE METABOLIC PANEL
ALT: 89 U/L — ABNORMAL HIGH (ref 0–44)
AST: 49 U/L — ABNORMAL HIGH (ref 15–41)
Albumin: 3.4 g/dL — ABNORMAL LOW (ref 3.5–5.0)
Alkaline Phosphatase: 51 U/L (ref 38–126)
Anion gap: 11 (ref 5–15)
BUN: 14 mg/dL (ref 6–20)
CO2: 21 mmol/L — ABNORMAL LOW (ref 22–32)
Calcium: 9 mg/dL (ref 8.9–10.3)
Chloride: 107 mmol/L (ref 98–111)
Creatinine, Ser: 0.73 mg/dL (ref 0.44–1.00)
GFR calc Af Amer: >60 mL/min (ref >60)
GFR calc non Af Amer: >60 mL/min (ref >60)
Glucose, Bld: 171 mg/dL — ABNORMAL HIGH (ref 70–99)
Potassium: 3.9 mmol/L (ref 3.5–5.1)
Sodium: 139 mmol/L (ref 135–145)
Total Bilirubin: 0.3 mg/dL (ref 0.3–1.2)
Total Protein: 7.5 g/dL (ref 6.5–8.1)

## 2018-10-31 LAB — CBC WITH DIFFERENTIAL/PLATELET
Abs Immature Granulocytes: 1 10*3/uL — ABNORMAL HIGH (ref 0.00–0.07)
Basophils Absolute: 0 10*3/uL (ref 0.0–0.1)
Basophils Relative: 0 %
Eosinophils Absolute: 0 10*3/uL (ref 0.0–0.5)
Eosinophils Relative: 0 %
HCT: 41.8 % (ref 36.0–46.0)
Hemoglobin: 13.6 g/dL (ref 12.0–15.0)
Lymphocytes Relative: 30 %
Lymphs Abs: 3.9 10*3/uL (ref 0.7–4.0)
MCH: 30.4 pg (ref 26.0–34.0)
MCHC: 32.5 g/dL (ref 30.0–36.0)
MCV: 93.3 fL (ref 80.0–100.0)
Metamyelocytes Relative: 2 %
Monocytes Absolute: 0.6 10*3/uL (ref 0.1–1.0)
Monocytes Relative: 5 %
Myelocytes: 6 %
Neutro Abs: 7.4 10*3/uL (ref 1.7–7.7)
Neutrophils Relative %: 57 %
Platelets: 403 10*3/uL — ABNORMAL HIGH (ref 150–400)
RBC: 4.48 MIL/uL (ref 3.87–5.11)
RDW: 13.2 % (ref 11.5–15.5)
WBC: 12.9 10*3/uL — ABNORMAL HIGH (ref 4.0–10.5)
nRBC: 0.3 % — ABNORMAL HIGH (ref 0.0–0.2)

## 2018-10-31 MED ORDER — DEXAMETHASONE SODIUM PHOSPHATE 4 MG/ML IJ SOLN: 1.0000 mg | INTRAMUSCULAR | Status: DC

## 2018-10-31 MED ORDER — SODIUM CHLORIDE 0.9 % IV SOLN
100.0000 mg | INTRAVENOUS | Status: AC
  Administered 2018-10-31: 100 mg via INTRAVENOUS

## 2018-10-31 NOTE — Discharge Summary (Signed)
Diana Chavez RVU:[id number] DOB: [birth year] DOA: 10/26/2018  PCP: Lennie Odor, PA-C  Admit date: 10/26/2018  Discharge date: 10/31/2018  Admitted From: Home   Disposition:  Home   Recommendations for Outpatient Follow-up:   Follow up with PCP in 1-2 weeks  PCP Please obtain BMP/CBC, 2 view CXR in 1week,  (see Discharge instructions)   PCP Please follow up on the following pending results: check CMP in 10-14 days   Home Health: None   Equipment/Devices: None  Consultations: None None Discharge Condition: Stable    CODE STATUS: Full    Diet Recommendation: Heart Healthy      Chief Complaint  Patient presents with  . Cough  . Covid Positive  . Shortness of Breath    started on tuesday      Brief history of present illness from the day of admission and additional interim summary    28 year old female who presented with dyspnea, cough and fevers. She does have significant past medical history for hypothyroidism. She tested positive for COVID-19 on October 18, 2018, patient had persistent fevers, dyspnea, chills, and cough. She was noted to be dyspneic at rest, she was diagnosed with COVID-19 pneumonia and admitted to the hospital.                                                                  Hospital Course   1. Mild Covid 19 Viral Pneumonitis during the ongoing 2020 Covid 19 Pandemic - she is currently on low-dose steroids along with IV remdesivir.  Inflammatory markers stable and she is symptom free on room air.  She will finish her treatment today and DC home.  COVID-19 Labs  Recent Labs    10/29/18 0239  DDIMER 0.65*  CRP 2.0*    Lab Results  Component Value Date   SARSCOV2NAA POSITIVE (A) 10/27/2018    Hepatic Function Latest Ref Rng & Units 10/31/2018 10/30/2018  10/29/2018  Total Protein 6.5 - 8.1 g/dL 7.5 7.5 7.6  Albumin 3.5 - 5.0 g/dL 3.4(L) 3.5 3.5  AST 15 - 41 U/L 49(H) 46(H) 53(H)  ALT 0 - 44 U/L 89(H) 75(H) 74(H)  Alk Phosphatase 38 - 126 U/L 51 48 51  Total Bilirubin 0.3 - 1.2 mg/dL 0.3 0.4 0.6    2.  Mild transaminitis.  Due to COVID-19 infection and remdesivir use.  Asymptomatic with stable, trend, PCP to recheck CMP in  2 weeks.    3. Hypothyroidism.  Continue home dose Synthroid.  4.  Obesity.  BMI 36.  Follow with PCP for weight loss.  Discharge diagnosis     Principal Problem:   Pneumonia due to COVID-19 virus Active Problems:   Hypokalemia   Elevated transaminase level   Acute respiratory failure with hypoxia (HCC)   Thyroid disease  Discharge instructions    Discharge Instructions    Diet - low sodium heart healthy   Complete by: As directed    Discharge instructions   Complete by: As directed    Follow with Primary MD Redmon, Noelle, PA-C in 7 days   Get CBC, CMP, 2 view Chest X ray -  checked next visit within 1 week by Primary MD   Activity: As tolerated with Full fall precautions use walker/cane & assistance as needed  Disposition Home    Diet: Heart Healthy    Special Instructions: If you have smoked or chewed Tobacco  in the last 2 yrs please stop smoking, stop any regular Alcohol  and or any Recreational drug use.  On your next visit with your primary care physician please Get Medicines reviewed and adjusted.  Please request your Prim.MD to go over all Hospital Tests and Procedure/Radiological results at the follow up, please get all Hospital records sent to your Prim MD by signing hospital release before you go home.  If you experience worsening of your admission symptoms, develop shortness of breath, life threatening emergency, suicidal or homicidal thoughts you must seek medical attention immediately by calling 911 or calling your MD immediately  if symptoms less severe.  You Must read  complete instructions/literature along with all the possible adverse reactions/side effects for all the Medicines you take and that have been prescribed to you. Take any new Medicines after you have completely understood and accpet all the possible adverse reactions/side effects.   Increase activity slowly   Complete by: As directed    MyChart COVID-19 home monitoring program   Complete by: Oct 31, 2018    Is the patient willing to use the [place] for home monitoring?: Yes   Temperature monitoring   Complete by: Oct 31, 2018    After how many days would you like to receive a notification of this patient's flowsheet entries?: 1      Discharge Medications   Allergies as of 10/31/2018   No Known Allergies     Medication List    TAKE these medications   albuterol 108 (90 Base) MCG/ACT inhaler Commonly known as: VENTOLIN HFA Inhale 1-2 puffs into the lungs every 6 (six) hours as needed for wheezing or shortness of breath.   levothyroxine 175 MCG tablet Commonly known as: SYNTHROID Take 175 mcg by mouth daily before breakfast.       Follow-up Information    [facility] Follow up.   Why: You are scheduled with a practitioner from [facility]  for a telephonic hospital followup appointment on Monday 11/27/18 at 2:30pm . Please be available for the call, If you must resechedule please call [phone]  Contact information: [address] [place] [address]       Redmon, [place], [place]. Schedule an appointment as soon as possible for a visit in 1 week(s).   Specialty: Nurse Practitioner Contact information: [address] [place] [address] [phone]           Major procedures and Radiology Reports - PLEASE review detailed and final reports thoroughly  -        Dg Chest Port 1 View  Result Date: 10/26/2018 CLINICAL DATA:  Covid positive, cough EXAM: PORTABLE CHEST 1 VIEW COMPARISON:   None. FINDINGS: The heart size and mediastinal contours are within normal limits. Patchy airspace consolidation seen at the base of the right lung and left mid lung.  Overall shallow degree of aeration. IMPRESSION: Patchy airspace opacities in both lungs, right greater than left. The findings in the lungs are nonspecific, but concerning for atypical infection, which includes viral pneumonia. Electronically Signed   By: Prudencio Pair M.D.   On: 10/26/2018 22:43    Micro Results     Recent Results (from the past 240 hour(s))  Blood culture (routine x 2)     Status: None (Preliminary result)   Collection Time: 10/26/18 11:22 PM   Specimen: BLOOD  Result Value Ref Range Status   Specimen Description   Final    BLOOD RIGHT ANTECUBITAL Performed at [facility], [place] [address] [place], [place] [address]    Special Requests   Final    BOTTLES DRAWN AEROBIC AND ANAEROBIC Blood Culture adequate volume Performed at [facility] [address], [place], [place] [address]    Culture   Final    NO GROWTH 4 DAYS Performed at [facility], [place] [address], [place], [place] [address]    Report Status PENDING  Incomplete  SARS Coronavirus 2 by RT PCR (hospital order, performed in [facility] hospital lab) Nasopharyngeal Nasopharyngeal Swab     Status: Abnormal   Collection Time: 10/27/18 12:10 AM   Specimen: Nasopharyngeal Swab  Result Value Ref Range Status   SARS Coronavirus 2 POSITIVE (A) NEGATIVE Final    Comment: RESULT CALLED TO, READ BACK BY AND VERIFIED WITH: Lily Peer @ 1017 ON 10/27/2018 C VARNER (NOTE) If result is NEGATIVE SARS-CoV-2 target nucleic acids are NOT DETECTED. The SARS-CoV-2 RNA is generally detectable in upper and lower  respiratory specimens during the acute phase of infection. The lowest  concentration of SARS-CoV-2 viral copies this assay can detect is 250  copies / mL. A negative result does not preclude SARS-CoV-2  infection  and should not be used as the sole basis for treatment or other  patient management decisions.  A negative result may occur with  improper specimen collection / handling, submission of specimen other  than nasopharyngeal swab, presence of viral mutation(s) within the  areas targeted by this assay, and inadequate number of viral copies  (<250 copies / mL). A negative result must be combined with clinical  observations, patient history, and epidemiological information. If result is POSITIVE SARS-CoV-2 target nucleic acids are DETEC TED. The SARS-CoV-2 RNA is generally detectable in upper and lower  respiratory specimens during the acute phase of infection.  Positive  results are indicative of active infection with SARS-CoV-2.  Clinical  correlation with patient history and other diagnostic information is  necessary to determine patient infection status.  Positive results do  not rule out bacterial infection or co-infection with other viruses. If result is PRESUMPTIVE POSTIVE SARS-CoV-2 nucleic acids MAY BE PRESENT.   A presumptive positive result was obtained on the submitted specimen  and confirmed on repeat testing.  While 2019 novel coronavirus  (SARS-CoV-2) nucleic acids may be present in the submitted sample  additional confirmatory testing may be necessary for epidemiological  and / or clinical management purposes  to differentiate between  SARS-CoV-2 and other Sarbecovirus currently known to infect humans.  If clinically indicated additional testing with an alternate test  methodology (LAB7 453) is advised. The SARS-CoV-2 RNA is generally  detectable in upper and lower respiratory specimens during the acute  phase of infection. The expected result is Negative. Fact Sheet for Patients:  StrictlyIdeas.no Fact Sheet for Healthcare Providers: BankingDealers.co.za This test is  not yet approved or cleared by the [place] and has been authorized for detection and/or diagnosis of SARS-CoV-2 by FDA under an Emergency Use Authorization (EUA).  This EUA will remain in effect (meaning this test can be used) for the duration of the COVID-19 declaration under Section 564(b)(1) of the Act, 21 U.S.C. section 360bbb-3(b)(1), unless the authorization is terminated or revoked sooner. Performed at [facility], [place] [address], [place], [place] [address]     Today   Subjective    Diana Chavez today has no headache,no chest abdominal pain,no new weakness tingling or numbness, feels much better wants to go home today.     Objective   Blood pressure 98/64, pulse 61, temperature (!) 97.5 F (36.4 C), temperature source Oral, resp. rate 16, height '5\' 5"'$  (1.651 m), weight 99.8 kg, SpO2 100 %.   Intake/Output Summary (Last 24 hours) at 10/31/2018 0850 Last data filed at 10/30/2018 1330 Gross per 24 hour  Intake 480 ml  Output -  Net 480 ml    Exam Awake Alert, Oriented x 3, No new F.N deficits, Normal affect [place].AT,PERRAL Supple Neck,No JVD, No cervical lymphadenopathy appriciated.  Symmetrical Chest wall movement, Good air movement bilaterally, CTAB RRR,No Gallops,Rubs or new Murmurs, No Parasternal Heave +ve B.Sounds, Abd Soft, Non tender, No organomegaly appriciated, No rebound -guarding or rigidity. No Cyanosis, Clubbing or edema, No new Rash or bruise   Data Review   CBC w Diff:  Lab Results  Component Value Date   WBC 12.9 (H) 10/31/2018   HGB 13.6 10/31/2018   HCT 41.8 10/31/2018   PLT 403 (H) 10/31/2018   LYMPHOPCT 30 10/31/2018   MONOPCT 5 10/31/2018   EOSPCT 0 10/31/2018   BASOPCT 0 10/31/2018    CMP:  Lab Results  Component Value Date   NA 139 10/31/2018   K 3.9 10/31/2018   CL 107 10/31/2018   CO2 21 (L) 10/31/2018   BUN 14 10/31/2018   CREATININE 0.73 10/31/2018   PROT 7.5 10/31/2018   ALBUMIN 3.4 (L) 10/31/2018   BILITOT 0.3 10/31/2018   ALKPHOS  51 10/31/2018   AST 49 (H) 10/31/2018   ALT 89 (H) 10/31/2018  .   Total Time in preparing paper work, data evaluation and todays exam - 34 minutes  [name] M.D on 10/31/2018 at 8:50 AM  [facility]   Office  [phone]

## 2018-10-31 NOTE — Plan of Care (Signed)
  Problem: Respiratory: Goal: Will maintain a patent airway Outcome: Progressing Goal: Complications related to the disease process, condition or treatment will be avoided or minimized Outcome: Progressing   

## 2018-10-31 NOTE — Discharge Instructions (Signed)
Follow with Primary MD Redmon, Noelle, PA-C in 7 days   Get CBC, CMP, 2 view Chest X ray -  checked next visit within 1 week by Primary MD   Activity: As tolerated with Full fall precautions use walker/cane & assistance as needed  Disposition Home    Diet: Heart Healthy    Special Instructions: If you have smoked or chewed Tobacco  in the last 2 yrs please stop smoking, stop any regular Alcohol  and or any Recreational drug use.  On your next visit with your primary care physician please Get Medicines reviewed and adjusted.  Please request your Prim.MD to go over all Hospital Tests and Procedure/Radiological results at the follow up, please get all Hospital records sent to your Prim MD by signing hospital release before you go home.  If you experience worsening of your admission symptoms, develop shortness of breath, life threatening emergency, suicidal or homicidal thoughts you must seek medical attention immediately by calling 911 or calling your MD immediately  if symptoms less severe.  You Must read complete instructions/literature along with all the possible adverse reactions/side effects for all the Medicines you take and that have been prescribed to you. Take any new Medicines after you have completely understood and accpet all the possible adverse reactions/side effects.      Person Under Monitoring Name: Diana Chavez  Location: [address] [place] [address]   Infection Prevention Recommendations for Individuals Confirmed to have, or Being Evaluated for, 2019 Novel Coronavirus (COVID-19) Infection Who Receive Care at Home  Individuals who are confirmed to have, or are being evaluated for, COVID-19 should follow the prevention steps below until a healthcare provider or local or state health department says they can return to normal activities.  Stay home except to get medical care You should restrict activities outside your home, except for getting medical  care. Do not go to work, school, or public areas, and do not use public transportation or taxis.  Call ahead before visiting your doctor Before your medical appointment, call the healthcare provider and tell them that you have, or are being evaluated for, COVID-19 infection. This will help the healthcare providers office take steps to keep other people from getting infected. Ask your healthcare provider to call the local or state health department.  Monitor your symptoms Seek prompt medical attention if your illness is worsening (e.g., difficulty breathing). Before going to your medical appointment, call the healthcare provider and tell them that you have, or are being evaluated for, COVID-19 infection. Ask your healthcare provider to call the local or state health department.  Wear a facemask You should wear a facemask that covers your nose and mouth when you are in the same room with other people and when you visit a healthcare provider. People who live with or visit you should also wear a facemask while they are in the same room with you.  Separate yourself from other people in your home As much as possible, you should stay in a different room from other people in your home. Also, you should use a separate bathroom, if available.  Avoid sharing household items You should not share dishes, drinking glasses, cups, eating utensils, towels, bedding, or other items with other people in your home. After using these items, you should wash them thoroughly with soap and water.  Cover your coughs and sneezes Cover your mouth and nose with a tissue when you cough or sneeze, or you can cough or sneeze  into your sleeve. Throw used tissues in a lined trash can, and immediately wash your hands with soap and water for at least 20 seconds or use an alcohol-based hand rub.  Wash your [facility] your hands often and thoroughly with soap and water for at least 20 seconds. You can use an alcohol-based  hand sanitizer if soap and water are not available and if your hands are not visibly dirty. Avoid touching your eyes, nose, and mouth with unwashed hands.   Prevention Steps for Caregivers and Household Members of Individuals Confirmed to have, or Being Evaluated for, COVID-19 Infection Being Cared for in the Home  If you live with, or provide care at home for, a person confirmed to have, or being evaluated for, COVID-19 infection please follow these guidelines to prevent infection:  Follow healthcare providers instructions Make sure that you understand and can help the patient follow any healthcare provider instructions for all care.  Provide for the patients basic needs You should help the patient with basic needs in the home and provide support for getting groceries, prescriptions, and other personal needs.  Monitor the patients symptoms If they are getting sicker, call his or her medical provider and tell them that the patient has, or is being evaluated for, COVID-19 infection. This will help the healthcare providers office take steps to keep other people from getting infected. Ask the healthcare provider to call the local or state health department.  Limit the number of people who have contact with the patient  If possible, have only one caregiver for the patient.  Other household members should stay in another home or place of residence. If this is not possible, they should stay  in another room, or be separated from the patient as much as possible. Use a separate bathroom, if available.  Restrict visitors who do not have an essential need to be in the home.  Keep older adults, very young children, and other sick people away from the patient Keep older adults, very young children, and those who have compromised immune systems or chronic health conditions away from the patient. This includes people with chronic heart, lung, or kidney conditions, diabetes, and  cancer.  Ensure good ventilation Make sure that shared spaces in the home have good air flow, such as from an air conditioner or an opened window, weather permitting.  Wash your hands often  Wash your hands often and thoroughly with soap and water for at least 20 seconds. You can use an alcohol based hand sanitizer if soap and water are not available and if your hands are not visibly dirty.  Avoid touching your eyes, nose, and mouth with unwashed hands.  Use disposable paper towels to dry your hands. If not available, use dedicated cloth towels and replace them when they become wet.  Wear a facemask and gloves  Wear a disposable facemask at all times in the room and gloves when you touch or have contact with the patients blood, body fluids, and/or secretions or excretions, such as sweat, saliva, sputum, nasal mucus, vomit, urine, or feces.  Ensure the mask fits over your nose and mouth tightly, and do not touch it during use.  Throw out disposable facemasks and gloves after using them. Do not reuse.  Wash your hands immediately after removing your facemask and gloves.  If your personal clothing becomes contaminated, carefully remove clothing and launder. Wash your hands after handling contaminated clothing.  Place all used disposable facemasks, gloves, and other waste in  a lined container before disposing them with other household waste.  Remove gloves and wash your hands immediately after handling these items.  Do not share dishes, glasses, or other household items with the patient  Avoid sharing household items. You should not share dishes, drinking glasses, cups, eating utensils, towels, bedding, or other items with a patient who is confirmed to have, or being evaluated for, COVID-19 infection.  After the person uses these items, you should wash them thoroughly with soap and water.  Wash laundry thoroughly  Immediately remove and wash clothes or bedding that have blood, body  fluids, and/or secretions or excretions, such as sweat, saliva, sputum, nasal mucus, vomit, urine, or feces, on them.  Wear gloves when handling laundry from the patient.  Read and follow directions on labels of laundry or clothing items and detergent. In general, wash and dry with the warmest temperatures recommended on the label.  Clean all areas the individual has used often  Clean all touchable surfaces, such as counters, tabletops, doorknobs, bathroom fixtures, toilets, phones, keyboards, tablets, and bedside tables, every day. Also, clean any surfaces that may have blood, body fluids, and/or secretions or excretions on them.  Wear gloves when cleaning surfaces the patient has come in contact with.  Use a diluted bleach solution (e.g., dilute bleach with 1 part bleach and 10 parts water) or a household disinfectant with a label that says EPA-registered for coronaviruses. To make a bleach solution at home, add 1 tablespoon of bleach to 1 quart (4 cups) of water. For a larger supply, add  cup of bleach to 1 gallon (16 cups) of water.  Read labels of cleaning products and follow recommendations provided on product labels. Labels contain instructions for safe and effective use of the cleaning product including precautions you should take when applying the product, such as wearing gloves or eye protection and making sure you have good ventilation during use of the product.  Remove gloves and wash hands immediately after cleaning.  Monitor yourself for signs and symptoms of illness Caregivers and household members are considered close contacts, should monitor their health, and will be asked to limit movement outside of the home to the extent possible. Follow the monitoring steps for close contacts listed on the symptom monitoring form.   ? If you have additional questions, contact your local health department or call the epidemiologist on call at [phone] (available 24/7). ? This  guidance is subject to change. For the most up-to-date guidance from [facility], please refer to their website: YouBlogs.pl

## 2018-10-31 NOTE — Progress Notes (Signed)
Discharge instructions completed with pt.  Pt verbalized understanding of the information.  Pt denies chest pain, shortness of breath, dizziness, lightheadedness, and n/v.  Pt's IV discontinued.  Pt waiting for ride home.  

## 2018-11-01 LAB — CULTURE, BLOOD (ROUTINE X 2)
Culture: NO GROWTH
Special Requests: ADEQUATE

## 2018-11-08 ENCOUNTER — Other Ambulatory Visit: Payer: Self-pay

## 2018-11-08 DIAGNOSIS — Z20822 Contact with and (suspected) exposure to covid-19: Secondary | ICD-10-CM

## 2018-11-10 LAB — NOVEL CORONAVIRUS, NAA: SARS-CoV-2, NAA: NOT DETECTED

## 2018-11-13 ENCOUNTER — Other Ambulatory Visit: Payer: Self-pay | Admitting: Physician Assistant

## 2018-11-13 ENCOUNTER — Ambulatory Visit
Admission: RE | Admit: 2018-11-13 | Discharge: 2018-11-13 | Disposition: A | Discharge status: Home / Self Care | Payer: No Typology Code available for payment source | Source: Ambulatory Visit | Attending: Physician Assistant | Admitting: Physician Assistant

## 2018-11-13 DIAGNOSIS — J189 Pneumonia, unspecified organism: Secondary | ICD-10-CM

## 2018-11-27 ENCOUNTER — Inpatient Hospital Stay: Payer: Self-pay

## 2018-11-28 ENCOUNTER — Inpatient Hospital Stay: Payer: Self-pay

## 2018-12-14 ENCOUNTER — Ambulatory Visit
Admission: RE | Admit: 2018-12-14 | Discharge: 2018-12-14 | Disposition: A | Discharge status: Home / Self Care | Payer: Self-pay | Source: Ambulatory Visit | Attending: Physician Assistant | Admitting: Physician Assistant

## 2018-12-14 ENCOUNTER — Other Ambulatory Visit: Payer: Self-pay | Admitting: Physician Assistant

## 2018-12-14 DIAGNOSIS — R0602 Shortness of breath: Secondary | ICD-10-CM

## 2019-01-17 ENCOUNTER — Encounter (HOSPITAL_COMMUNITY): Payer: Self-pay | Admitting: Emergency Medicine

## 2019-01-17 ENCOUNTER — Other Ambulatory Visit: Payer: Self-pay

## 2019-01-17 ENCOUNTER — Emergency Department (HOSPITAL_COMMUNITY)
Admission: EM | Admit: 2019-01-17 | Discharge: 2019-01-17 | Disposition: A | Discharge status: Home / Self Care | Payer: 59 | Attending: Emergency Medicine | Admitting: Emergency Medicine

## 2019-01-17 ENCOUNTER — Emergency Department (HOSPITAL_COMMUNITY): Payer: 59

## 2019-01-17 DIAGNOSIS — J9801 Acute bronchospasm: Secondary | ICD-10-CM | POA: Diagnosis not present

## 2019-01-17 DIAGNOSIS — Z79899 Other long term (current) drug therapy: Secondary | ICD-10-CM | POA: Insufficient documentation

## 2019-01-17 DIAGNOSIS — Z8616 Personal history of COVID-19: Secondary | ICD-10-CM | POA: Diagnosis not present

## 2019-01-17 DIAGNOSIS — R0602 Shortness of breath: Secondary | ICD-10-CM | POA: Diagnosis present

## 2019-01-17 MED ORDER — IPRATROPIUM BROMIDE 0.02 % IN SOLN
0.5000 mg | Freq: Once | RESPIRATORY_TRACT | Status: AC
  Administered 2019-01-17: 0.5 mg via RESPIRATORY_TRACT
  Filled 2019-01-17: qty 2.5

## 2019-01-17 MED ORDER — DEXAMETHASONE SODIUM PHOSPHATE 10 MG/ML IJ SOLN
10.0000 mg | Freq: Once | INTRAMUSCULAR | Status: AC
  Administered 2019-01-17: 10 mg via INTRAMUSCULAR
  Filled 2019-01-17: qty 1

## 2019-01-17 MED ORDER — ALBUTEROL SULFATE (2.5 MG/3ML) 0.083% IN NEBU
5.0000 mg | INHALATION_SOLUTION | Freq: Once | RESPIRATORY_TRACT | Status: AC
  Administered 2019-01-17: 04:00:00 5 mg via RESPIRATORY_TRACT
  Filled 2019-01-17: qty 6

## 2019-01-17 NOTE — ED Provider Notes (Signed)
[facility] DEPT Provider Note: Georgena Spurling, MD, FACEP  CSN: [id number] MRN: [id number] ARRIVAL: 01/17/19 at [place]: [facility] [place]   HISTORY OF PRESENT ILLNESS  01/17/19 3:13 AM Diana Chavez is a 29 y.o. female who had COVID-19 disease about 3 months ago.  She has had persistent shortness of breath since then which is acutely worsened since yesterday.  Symptoms are moderate to severe, especially with exertion.  She has been using her albuterol inhaler without adequate relief.  She denies any pain, chest pain, fever, chills or body aches.   Past Medical History:  Diagnosis Date  . Thyroid disease     History reviewed. No pertinent surgical history.  No family history on file.  Social History   Tobacco Use  . Smoking status: Never Smoker  . Smokeless tobacco: Never Used  Substance Use Topics  . Alcohol use: Not Currently  . Drug use: Not Currently    Prior to Admission medications   Medication Sig Start Date End Date Taking? Authorizing Provider  albuterol (VENTOLIN HFA) 108 (90 Base) MCG/ACT inhaler Inhale 1-2 puffs into the lungs every 6 (six) hours as needed for wheezing or shortness of breath.   Yes [provider]  levothyroxine (SYNTHROID) 175 MCG tablet Take 175 mcg by mouth daily before breakfast.   Yes [provider]    Allergies Patient has no known allergies.   REVIEW OF SYSTEMS  Negative except as noted here or in the History of Present Illness.   PHYSICAL EXAMINATION  Initial Vital Signs Blood pressure 134/90, pulse 84, temperature 98.2 F (36.8 C), temperature source Oral, resp. rate 20, height 5\' 5"  (1.651 m), weight 102.1 kg, SpO2 97 %.  Examination General: Well-developed, well-nourished female in no acute distress; appearance consistent with age of record HENT: normocephalic; atraumatic Eyes: pupils equal, round and reactive to light; extraocular muscles intact Neck:  supple Heart: regular rate and rhythm Lungs: Decreased air movement bilaterally; faint expiratory wheeze in left base Abdomen: soft; nondistended; nontender; bowel sounds present Extremities: No deformity; full range of motion; pulses normal Neurologic: Awake, alert and oriented; motor function intact in all extremities and symmetric; no facial droop Skin: Warm and dry Psychiatric: Normal mood and affect   RESULTS  Summary of this visit's results, reviewed and interpreted by myself:   EKG Interpretation  Date/Time:  Wednesday January 17 2019 01:52:11 EST Ventricular Rate:  74 PR Interval:    QRS Duration: 101 QT Interval:  386 QTC Calculation: 429 R Axis:   64 Text Interpretation: Sinus rhythm Borderline repolarization abnormality Rate is slower Confirmed by Ganon Demasi [phone]) on 01/17/2019 1:59:35 AM      Laboratory Studies: No results found for this or any previous visit (from the past 24 hour(s)). Imaging Studies: CXR PA/Lat  Result Date: 01/17/2019 CLINICAL DATA:  Shortness of breath, prior COVID EXAM: CHEST - 2 VIEW COMPARISON:  December 14, 2018 FINDINGS: The heart size and mediastinal contours are within normal limits. Both lungs are clear. The visualized skeletal structures are unremarkable. IMPRESSION: No active cardiopulmonary disease. Electronically Signed   By: Prudencio Pair M.D.   On: 01/17/2019 04:52    ED COURSE and MDM  Nursing notes, initial and subsequent vitals signs, including pulse oximetry, reviewed and interpreted by myself.  Vitals:   01/17/19 0142 01/17/19 0218 01/17/19 0524  BP: (!) 145/109 134/90 (!) 141/94  Pulse: 82 84 80  Resp: 20 20 16   Temp: 98.2 F (36.8  C)    TempSrc: Oral    SpO2: 98% 97% 95%  Weight: 102.1 kg    Height: 5\' 5"  (1.651 m)     Medications  albuterol (PROVENTIL) (2.5 MG/3ML) 0.083% nebulizer solution 5 mg (5 mg Nebulization Given 01/17/19 0349)  ipratropium (ATROVENT) nebulizer solution 0.5 mg (0.5 mg Nebulization Given  01/17/19 0349)  dexamethasone (DECADRON) injection 10 mg (10 mg Intramuscular Given 01/17/19 0348)    5:26 AM Air movement improved, patient feels better after albuterol and Atrovent neb treatment.  Patient was encouraged to use her inhaler.  She was given dexamethasone 10 mg IM.  PROCEDURES  Procedures   ED DIAGNOSES     ICD-10-CM   1. Acute bronchospasm  J98.01   2. History of 2019 novel coronavirus disease (COVID-19)  Z86.16        Diana Curless, MD 01/17/19 [phone]

## 2019-01-17 NOTE — ED Notes (Signed)
Resp tech called for Tx

## 2019-01-17 NOTE — ED Triage Notes (Signed)
Pt states that she had covid in October and has been struggling with [facility] since then.  Pt states that all of a sudden last night, pt states that she couldn't catch her breath.  Progressively worse since then.  Pt denies any recent calf swelling/pain.  Denies CP.

## 2019-03-23 ENCOUNTER — Other Ambulatory Visit: Payer: Self-pay

## 2019-03-23 ENCOUNTER — Encounter (HOSPITAL_COMMUNITY): Payer: Self-pay | Admitting: Emergency Medicine

## 2019-03-23 ENCOUNTER — Emergency Department (HOSPITAL_COMMUNITY)
Admission: EM | Admit: 2019-03-23 | Discharge: 2019-03-23 | Disposition: A | Discharge status: Home / Self Care | Payer: Self-pay | Attending: Emergency Medicine | Admitting: Emergency Medicine

## 2019-03-23 ENCOUNTER — Emergency Department (HOSPITAL_COMMUNITY): Payer: Self-pay

## 2019-03-23 DIAGNOSIS — Z8616 Personal history of COVID-19: Secondary | ICD-10-CM | POA: Insufficient documentation

## 2019-03-23 DIAGNOSIS — J40 Bronchitis, not specified as acute or chronic: Secondary | ICD-10-CM | POA: Insufficient documentation

## 2019-03-23 DIAGNOSIS — Z79899 Other long term (current) drug therapy: Secondary | ICD-10-CM | POA: Diagnosis not present

## 2019-03-23 DIAGNOSIS — R0602 Shortness of breath: Secondary | ICD-10-CM | POA: Diagnosis present

## 2019-03-23 MED ORDER — IPRATROPIUM BROMIDE HFA 17 MCG/ACT IN AERS
2.0000 | INHALATION_SPRAY | Freq: Once | RESPIRATORY_TRACT | Status: AC
  Administered 2019-03-23: 2 via RESPIRATORY_TRACT
  Filled 2019-03-23: qty 12.9

## 2019-03-23 MED ORDER — AEROCHAMBER PLUS FLO-VU LARGE MISC
Status: AC
  Filled 2019-03-23: qty 1

## 2019-03-23 MED ORDER — ALBUTEROL SULFATE HFA 108 (90 BASE) MCG/ACT IN AERS: 1.0000 | INHALATION_SPRAY | Freq: Four times a day (QID) | RESPIRATORY_TRACT | 2 refills | Status: DC | PRN

## 2019-03-23 MED ORDER — AEROCHAMBER PLUS FLO-VU LARGE MISC
1.0000 | Freq: Once | Status: AC
  Administered 2019-03-23: 1

## 2019-03-23 NOTE — ED Triage Notes (Signed)
Pt reports SOB that started Sunday, pt reports having covid in October and had to come back to hospital in January due to shortness of breath. Pt has been using a inhaler at home with no relief but states she came in to receive a breathing treatment. Pt also reports dry cough and at times with walking "feels like she is wheezing". Pt is no acute distress, able to speak in complete sentences. Pt denies any CP, fever or body aches.

## 2019-03-23 NOTE — Discharge Instructions (Signed)
Follow-up as discussed with the post Covid clinic.  Continue with your Claritin daily.  Use of Flonase daily. You can use the Atrovent inhaler 2 puffs twice daily.  Use your albuterol inhaler in between as needed every 4-6 hours.

## 2019-03-23 NOTE — ED Notes (Signed)
Patient verbalizes understanding of discharge instructions. Opportunity for questioning and answers were provided. Armband removed by staff, pt discharged from ED.  

## 2019-03-23 NOTE — ED Notes (Signed)
First pyxis had no chambers.

## 2019-03-23 NOTE — ED Provider Notes (Signed)
[facility] EMERGENCY DEPARTMENT Provider Note   CSN: [id number] Arrival date & time: 03/23/19  1705     History Chief Complaint  Patient presents with  . Shortness of Breath    Diana Chavez is a 29 y.o. female.  29year old female with complaint of shortness of breath with productive cough, onset Sunday, possibly related to allergies. Patient has albuterol inhaler from October from prior COVID illness, using inhaler every hour without improvement in [facility]. Denies fever, congestion, sore throat.  Taking Claritin for her allergies.        Past Medical History:  Diagnosis Date  . Thyroid disease     Patient Active Problem List   Diagnosis Date Noted  . Pneumonia due to COVID-19 virus 10/27/2018  . Hypokalemia 10/27/2018  . Elevated transaminase level 10/27/2018  . Acute respiratory failure with hypoxia ([facility]) 10/27/2018  . Thyroid disease     History reviewed. No pertinent surgical history.   OB History   No obstetric history on file.     No family history on file.  Social History   Tobacco Use  . Smoking status: Never Smoker  . Smokeless tobacco: Never Used  Substance Use Topics  . Alcohol use: Not Currently  . Drug use: Not Currently    Home Medications Prior to Admission medications   Medication Sig Start Date End Date Taking? Authorizing Provider  albuterol (VENTOLIN HFA) 108 (90 Base) MCG/ACT inhaler Inhale 1-2 puffs into the lungs every 6 (six) hours as needed for wheezing or shortness of breath.    [provider]  albuterol (VENTOLIN HFA) 108 (90 Base) MCG/ACT inhaler Inhale 1-2 puffs into the lungs every 6 (six) hours as needed for wheezing or shortness of breath. 03/23/19   Tacy Learn, PA-C  levothyroxine (SYNTHROID) 175 MCG tablet Take 175 mcg by mouth daily before breakfast.    [provider]    Allergies    Patient has no known allergies.  Review of Systems   Review of Systems  Constitutional:  Negative for fever.  HENT: Negative for congestion.   Respiratory: Positive for cough, shortness of breath and wheezing.   Gastrointestinal: Negative for nausea and vomiting.  Musculoskeletal: Negative for arthralgias and myalgias.  Skin: Negative for wound.  Allergic/Immunologic: Negative for immunocompromised state.  Hematological: Negative for adenopathy.  Psychiatric/Behavioral: Negative for confusion.  All other systems reviewed and are negative.   Physical Exam Updated Vital Signs BP (!) 149/95 (BP Location: Right Arm)   Pulse 90   Temp 98.5 F (36.9 C) (Oral)   Resp 16   Ht '5\' 5"'$  (1.651 m)   Wt 99.8 kg   SpO2 99%   BMI 36.61 kg/m   Physical Exam Vitals and nursing note reviewed.  Constitutional:      General: She is not in acute distress.    Appearance: She is well-developed. She is not diaphoretic.  HENT:     Head: Normocephalic and atraumatic.  Cardiovascular:     Rate and Rhythm: Normal rate and regular rhythm.  Pulmonary:     Effort: Pulmonary effort is normal.     Breath sounds: Examination of the right-upper field reveals wheezing. Examination of the left-upper field reveals wheezing. Wheezing present. No decreased breath sounds.  Chest:     Chest wall: No tenderness.  Musculoskeletal:     Right lower leg: No edema.     Left lower leg: No edema.  Skin:    General: Skin is warm and dry.  Neurological:     Mental Status: She is alert and oriented to person, place, and time.  Psychiatric:        Behavior: Behavior normal.     ED Results / Procedures / Treatments   Labs (all labs ordered are listed, but only abnormal results are displayed) Labs Reviewed - No data to display  EKG None  Radiology DG Chest 2 View  Result Date: 03/23/2019 CLINICAL DATA:  Shortness of breath EXAM: CHEST - 2 VIEW COMPARISON:  01/17/2019 FINDINGS: The heart size and mediastinal contours are within normal limits. Both lungs are clear. The visualized skeletal structures  are unremarkable. IMPRESSION: No active cardiopulmonary disease. Electronically Signed   By: Donavan Foil M.D.   On: 03/23/2019 17:46    Procedures Procedures (including critical care time)  Medications Ordered in ED Medications  AeroChamber Plus Flo-Vu Large MISC (has no administration in time range)  ipratropium (ATROVENT HFA) inhaler 2 puff (2 puffs Inhalation Given 03/23/19 1910)  AeroChamber Plus Flo-Vu Large MISC 1 each (1 each Other Given 03/23/19 1910)  AeroChamber Plus Flo-Vu Large MISC (  Given 03/23/19 1910)    ED Course  I have reviewed the triage vital signs and the nursing notes.  Pertinent labs & imaging results that were available during my care of the patient were reviewed by me and considered in my medical decision making (see chart for details).  Clinical Course as of Mar 23 2002  Fri Mar 22, 4124  319 29 year old female presents with complaint of productive cough and shortness of breath.  Patient reports having COVID-19 in October 2020, has been using her albuterol inhaler left over from that illness without improvement in her symptoms.  Patient called her PCP and was told that there was not anything stronger that they could give her so she came to the emergency room.  Patient feels like her symptoms are related to seasonal allergies, has been taking Claritin without improvement.  Patient denies history of asthma or wheezing illness prior to having Covid.  Patient is otherwise well-appearing, able to speak in complete sentences without difficulty.  Dust x-ray is unremarkable.  Patient was given 2 puffs of Atrovent inhaler with improvement in symptoms.  Plan is for patient to be discharged to follow-up with PCP.  Patient is also met with case management in the ER tonight who has referred patient to the post Covid clinic for follow-up as well.   [LM]    Clinical Course User Index [LM] [name]   MDM Rules/Calculators/A&P                      Final Clinical  Impression(s) / ED Diagnoses Final diagnoses:  Bronchitis    Rx / DC Orders ED Discharge Orders         Ordered    albuterol (VENTOLIN HFA) 108 (90 Base) MCG/ACT inhaler  Every 6 hours PRN     03/23/19 1959           Tacy Learn, PA-C 03/23/19 2004    Lennice Sites, DO 03/23/19 2012

## 2019-03-23 NOTE — Care Management (Signed)
ED CM met with patient at bedside to discuss transitional care at the [facility]. Patient may benefit from services rendered at the clinic, Patient was given information to contact the scheduler for an appointment.

## 2019-03-23 NOTE — ED Notes (Signed)
CM at bedside

## 2019-03-25 ENCOUNTER — Emergency Department (HOSPITAL_COMMUNITY)
Admission: EM | Admit: 2019-03-25 | Discharge: 2019-03-25 | Disposition: A | Discharge status: Home / Self Care | Payer: Self-pay | Attending: Emergency Medicine | Admitting: Emergency Medicine

## 2019-03-25 ENCOUNTER — Encounter (HOSPITAL_COMMUNITY): Payer: Self-pay

## 2019-03-25 ENCOUNTER — Emergency Department (HOSPITAL_COMMUNITY): Payer: Self-pay

## 2019-03-25 ENCOUNTER — Other Ambulatory Visit: Payer: Self-pay

## 2019-03-25 DIAGNOSIS — R0602 Shortness of breath: Secondary | ICD-10-CM | POA: Diagnosis present

## 2019-03-25 DIAGNOSIS — Z8616 Personal history of COVID-19: Secondary | ICD-10-CM | POA: Insufficient documentation

## 2019-03-25 DIAGNOSIS — R0989 Other specified symptoms and signs involving the circulatory and respiratory systems: Secondary | ICD-10-CM | POA: Diagnosis not present

## 2019-03-25 LAB — BASIC METABOLIC PANEL
Anion gap: 11 (ref 5–15)
BUN: 9 mg/dL (ref 6–20)
CO2: 26 mmol/L (ref 22–32)
Calcium: 9.5 mg/dL (ref 8.9–10.3)
Chloride: 104 mmol/L (ref 98–111)
Creatinine, Ser: 0.79 mg/dL (ref 0.44–1.00)
GFR calc Af Amer: >60 mL/min (ref >60)
GFR calc non Af Amer: >60 mL/min (ref >60)
Glucose, Bld: 121 mg/dL — ABNORMAL HIGH (ref 70–99)
Potassium: 3.6 mmol/L (ref 3.5–5.1)
Sodium: 141 mmol/L (ref 135–145)

## 2019-03-25 LAB — CBC WITH DIFFERENTIAL/PLATELET
Abs Immature Granulocytes: 0.03 10*3/uL (ref 0.00–0.07)
Basophils Absolute: 0.1 10*3/uL (ref 0.0–0.1)
Basophils Relative: 1 %
Eosinophils Absolute: 0.6 10*3/uL — ABNORMAL HIGH (ref 0.0–0.5)
Eosinophils Relative: 6 %
HCT: 47.7 % — ABNORMAL HIGH (ref 36.0–46.0)
Hemoglobin: 15.3 g/dL — ABNORMAL HIGH (ref 12.0–15.0)
Immature Granulocytes: 0 %
Lymphocytes Relative: 40 %
Lymphs Abs: 4 10*3/uL (ref 0.7–4.0)
MCH: 30.1 pg (ref 26.0–34.0)
MCHC: 32.1 g/dL (ref 30.0–36.0)
MCV: 93.7 fL (ref 80.0–100.0)
Monocytes Absolute: 0.9 10*3/uL (ref 0.1–1.0)
Monocytes Relative: 9 %
Neutro Abs: 4.4 10*3/uL (ref 1.7–7.7)
Neutrophils Relative %: 44 %
Platelets: 303 10*3/uL (ref 150–400)
RBC: 5.09 MIL/uL (ref 3.87–5.11)
RDW: 12.9 % (ref 11.5–15.5)
WBC: 9.9 10*3/uL (ref 4.0–10.5)
nRBC: 0 % (ref 0.0–0.2)

## 2019-03-25 LAB — TROPONIN I (HIGH SENSITIVITY): Troponin I (High Sensitivity): 3 ng/L (ref <18)

## 2019-03-25 LAB — POC URINE PREG, ED: Preg Test, Ur: NEGATIVE

## 2019-03-25 MED ORDER — ALBUTEROL SULFATE HFA 108 (90 BASE) MCG/ACT IN AERS: 1.0000 | INHALATION_SPRAY | Freq: Four times a day (QID) | RESPIRATORY_TRACT | 0 refills | Status: DC | PRN

## 2019-03-25 MED ORDER — PREDNISONE 50 MG PO TABS: 50.0000 mg | ORAL_TABLET | Freq: Every day | ORAL | 0 refills | Status: AC

## 2019-03-25 MED ORDER — IPRATROPIUM BROMIDE HFA 17 MCG/ACT IN AERS
4.0000 | INHALATION_SPRAY | Freq: Once | RESPIRATORY_TRACT | Status: AC
  Administered 2019-03-25: 4 via RESPIRATORY_TRACT
  Filled 2019-03-25: qty 12.9

## 2019-03-25 MED ORDER — IPRATROPIUM BROMIDE HFA 17 MCG/ACT IN AERS
2.0000 | INHALATION_SPRAY | Freq: Once | RESPIRATORY_TRACT | Status: AC
  Administered 2019-03-25: 2 via RESPIRATORY_TRACT

## 2019-03-25 MED ORDER — PREDNISONE 20 MG PO TABS
60.0000 mg | ORAL_TABLET | Freq: Once | ORAL | Status: AC
  Administered 2019-03-25: 13:00:00 60 mg via ORAL
  Filled 2019-03-25: qty 3

## 2019-03-25 MED ORDER — ALBUTEROL SULFATE HFA 108 (90 BASE) MCG/ACT IN AERS
6.0000 | INHALATION_SPRAY | Freq: Once | RESPIRATORY_TRACT | Status: AC
  Administered 2019-03-25: 6 via RESPIRATORY_TRACT

## 2019-03-25 MED ORDER — ALBUTEROL SULFATE HFA 108 (90 BASE) MCG/ACT IN AERS
6.0000 | INHALATION_SPRAY | Freq: Once | RESPIRATORY_TRACT | Status: AC
  Administered 2019-03-25: 13:00:00 6 via RESPIRATORY_TRACT
  Filled 2019-03-25: qty 6.7

## 2019-03-25 NOTE — Discharge Instructions (Signed)
Is very important you follow-up with pulmonology.  You need to call them to schedule appointment tomorrow.

## 2019-03-25 NOTE — ED Notes (Signed)
Patient transported to XR. 

## 2019-03-25 NOTE — ED Provider Notes (Signed)
  Face-to-face evaluation   History: She presents for worsening trouble with breathing, onset several months ago, initially was using only albuterol sporadically.  In the last 3 days she has had increased trouble breathing, as well as dyspnea on exertion.  She denies chest pain, focal weakness or paresthesia.  She was seen in the ED 3 days ago, after encouraged to go there by her PCP who recommended that she have a repeat Covid test, at that time was given Atrovent to use in addition to the albuterol inhaler.  She states that this combination has not changed or improved her status.  She denies previous pulmonary disorders prior to her Covid infection in October 2020.  She was hospitalized for 1 week, received remdesivir and steroids, but did not have an oxygen requirement at that time.    Physical exam: Obese, alert and cooperative.  No respiratory distress.  Lungs have somewhat unusual respiratory pattern, with forced end expiration.  Scattered rhonchi, no wheezes.  Overall decreased air movement on auscultation.  Medical screening examination/treatment/procedure(s) were conducted as a shared visit with non-physician practitioner(s) and myself.  I personally evaluated the patient during the encounter    Mancel Bale, MD 03/26/19 2326

## 2019-03-25 NOTE — ED Provider Notes (Signed)
[facility]-EMERGENCY DEPT Provider Note   CSN: [id number] Arrival date & time: 03/25/19  1205   History Chief Complaint  Patient presents with  . Shortness of Breath   Diana Chavez is Chavez 29 y.o. female with past medical history significant for COVID infection (October) who presents for evaluation of SOB. Has been SOB since COVId diagnosis.  Require hospitalization for Covid diagnosis for 1 week.  Seen 2 days ago for similar complaints. DC home with Albuterol. States continued SOB. Admits to some chest tightness. Has been using Albuterol without relief.  She has shortness of breath which is worse with exertion.  Not followed up with Chavez PCP.  She has been using her albuterol inhaler without relief.  She admits to some chest tightness without overt chest pain.  Pain does not radiate.  Denies fever, chills, nausea, vomiting abdominal pain, diarrhea, dysuria.  No prior history of pulmonary disorders, PE or DVT.  She denies any unilateral leg swelling, redness or warmth.  Denies aggravating or alleviating factors.  History from patient and past medical records. No interpretor was used.  HPI     Past Medical History:  Diagnosis Date  . Thyroid disease     Patient Active Problem List   Diagnosis Date Noted  . Pneumonia due to COVID-19 virus 10/27/2018  . Hypokalemia 10/27/2018  . Elevated transaminase level 10/27/2018  . Acute respiratory failure with hypoxia ([facility]) 10/27/2018  . Thyroid disease     History reviewed. No pertinent surgical history.   OB History   No obstetric history on file.     History reviewed. No pertinent family history.  Social History   Tobacco Use  . Smoking status: Never Smoker  . Smokeless tobacco: Never Used  Substance Use Topics  . Alcohol use: Not Currently  . Drug use: Not Currently    Home Medications Prior to Admission medications   Medication Sig Start Date End Date Taking? Authorizing Provider  albuterol  (VENTOLIN HFA) 108 (90 Base) MCG/ACT inhaler Inhale 1-2 puffs into the lungs every 6 (six) hours as needed for wheezing or shortness of breath. 03/25/19   Diana Edelman A, PA-C  levothyroxine (SYNTHROID) 175 MCG tablet Take 175 mcg by mouth daily before breakfast.    [provider]  predniSONE (DELTASONE) 50 MG tablet Take 1 tablet (50 mg total) by mouth daily for 10 days. 03/25/19 04/04/19  Diana Macknight A, PA-C    Allergies    Patient has no known allergies.  Review of Systems   Review of Systems  Constitutional: Negative.   HENT: Negative.   Respiratory: Positive for cough, chest tightness and shortness of breath. Negative for apnea, choking, wheezing and stridor.   Cardiovascular: Negative.   Gastrointestinal: Negative.   Genitourinary: Negative.   Musculoskeletal: Negative.   Skin: Negative.   Neurological: Negative.   All other systems reviewed and are negative.   Physical Exam Updated Vital Signs BP (!) 142/86   Pulse 99   Temp 98.2 F (36.8 C) (Oral)   Resp (!) 24   LMP 01/15/2019 (Approximate)   SpO2 94%   Physical Exam Vitals and nursing note reviewed.  Constitutional:      General: She is not in acute distress.    Appearance: She is well-developed. She is not toxic-appearing or diaphoretic.  HENT:     Head: Normocephalic and atraumatic.     Mouth/Throat:     Mouth: Mucous membranes are moist.     Pharynx: Oropharynx is clear.  Eyes:     Pupils: Pupils are equal, round, and reactive to light.  Cardiovascular:     Rate and Rhythm: Tachycardia present.  Pulmonary:     Effort: No respiratory distress.     Comments: Decreased air movement.  She has some coarse rhonchi and some mild wheeze.  No accessory muscle usage.  She is able speak in full sentences without difficulty. Abdominal:     General: There is no distension.     Comments: Soft, nontender  Musculoskeletal:        General: Normal range of motion.     Cervical back: Normal range of  motion.     Comments: Moves all 4 extremities at difficulty.  Compartments soft.  Diana Chavez' sign negative  Skin:    General: Skin is warm and dry.     Capillary Refill: Capillary refill takes less than 2 seconds.     Comments: Brisk capillary refill  Neurological:     Mental Status: She is alert.     ED Results / Procedures / Treatments   Labs (all labs ordered are listed, but only abnormal results are displayed) Labs Reviewed  CBC WITH DIFFERENTIAL/PLATELET - Abnormal; Notable for the following components:      Result Value   Hemoglobin 15.3 (*)    HCT 47.7 (*)    Eosinophils Absolute 0.6 (*)    All other components within normal limits  BASIC METABOLIC PANEL - Abnormal; Notable for the following components:   Glucose, Bld 121 (*)    All other components within normal limits  POC URINE PREG, ED  TROPONIN I (HIGH SENSITIVITY)    EKG EKG Interpretation  Date/Time:  Sunday March 25 2019 12:27:12 EDT Ventricular Rate:  118 PR Interval:    QRS Duration: 77 QT Interval:  379 QTC Calculation: 532 R Axis:   72 Text Interpretation: Sinus tachycardia Right atrial enlargement Repol abnrm, global ischemia, diffuse leads Prolonged QT interval Baseline wander in lead(s) V1 V2 Since last tracing Rate faster , QT has lengthened , Right atrial hypertrophy is new, T wave abnormality is new Otherwise no significant change Confirmed by Diana Chavez [phone]) on 03/25/2019 12:44:41 PM   Radiology DG Chest 2 View  Result Date: 03/25/2019 CLINICAL DATA:  Pt presented with c/o shortness of breath since October when she had Covid. EXAM: CHEST - 2 VIEW COMPARISON:  Chest radiograph 03/23/2019 FINDINGS: The heart size and mediastinal contours are within normal limits. The lungs are clear. No pneumothorax or pleural effusion. The visualized skeletal structures are unremarkable. IMPRESSION: No acute cardiopulmonary finding. Electronically Signed   By: Diana Chavez M.D.   On: 03/25/2019 14:26     Procedures Procedures (including critical care time)  Medications Ordered in ED Medications  predniSONE (DELTASONE) tablet 60 mg (60 mg Oral Given 03/25/19 1238)  ipratropium (ATROVENT HFA) inhaler 4 puff (4 puffs Inhalation Given 03/25/19 1238)  albuterol (VENTOLIN HFA) 108 (90 Base) MCG/ACT inhaler 6 puff (6 puffs Inhalation Given 03/25/19 1238)  albuterol (VENTOLIN HFA) 108 (90 Base) MCG/ACT inhaler 6 puff (6 puffs Inhalation Given 03/25/19 1506)  ipratropium (ATROVENT HFA) inhaler 2 puff (2 puffs Inhalation Given 03/25/19 1506)    ED Course  I have reviewed the triage vital signs and the nursing notes.  Pertinent labs & imaging results that were available during my care of the patient were reviewed by me and considered in my medical decision making (see chart for details).  29 year old presents for SOB x months. Remote hx  of COVID infection. Seen 2 days ago for similar complaints. Chest film 2 days ago personally reviewed and interpreted without acute findings. She has had intermittent tachycardia without hypoxia.  Patient with some rhonchi and mild wheeze throughout.  Will get some labs, EKG, chest x-ray, steroids, albuterol and reassess  Labs and imaging personally reviewed and interpreted: CBC without leukocytosis Metabolic panel mild hyperglycemia to 121 Pregnancy test negative Troponin 3.  Low suspicion for ACS, would expect to be elevated given patient's length of symptoms Plain film chest without cardiomegaly, pulmonary edema, pneumothorax EKG sinus tachycardia, right atrial enlargement which is new.  Reassessed. Feels improved after steroids and albuterol however with continued wheeze and rhonchi. Will give additional breathing treatments.  Reassessed. Patient states she feels improvement and would like to go home.  We will start her on steroids outpatient.  Patient ambulatory in room without any hypoxia.  She did have some mild tachypnea to the mid 20s however states her  breathing feels like she is at her baseline.  Low suspicion for ACS, PE, dissection as cause of her symptoms.  Given she does have some right atrial enlargement do think is reasonable that she needs to be assessed outpatient by cardiology or pulmonology to rule out any sort of cardiomyopathy and assess her recurrent SOB since her COVID infection.  I do not think she needs emergent Echocardiogram at this time.  Discussed that she needs close follow-up with pulmonology or cardiology.  Mariann Laster with case management has put in referral for pulmonology outpatient as well as in discharge instructions.  I have discussed this with patient.   The patient has been appropriately medically screened and/or stabilized in the ED. I have low suspicion for any other emergent medical condition which would require further screening, evaluation or treatment in the ED or require inpatient management.  Patient is hemodynamically stable and in no acute distress.  Patient able to ambulate in department prior to ED.  Evaluation does not show acute pathology that would require ongoing or additional emergent interventions while in the emergency department or further inpatient treatment.  I have discussed the diagnosis with the patient and answered all questions.  Pain is been managed while in the emergency department and patient has no further complaints prior to discharge.  Patient is comfortable with plan discussed in room and is stable for discharge at this time.  I have discussed strict return precautions for returning to the emergency department.  Patient was encouraged to follow-up with PCP/specialist refer to at discharge.  Patient seen eval by attending physician, Dr. Eulis Foster who agrees with the treatment, plan and disposition.    MDM Rules/Calculators/Chavez&P                       Final Clinical Impression(s) / ED Diagnoses Final diagnoses:  SOB (shortness of breath)  Rhonchi    Rx / DC Orders ED Discharge Orders          Ordered    predniSONE (DELTASONE) 50 MG tablet  Daily     03/25/19 1548    albuterol (VENTOLIN HFA) 108 (90 Base) MCG/ACT inhaler  Every 6 hours PRN     03/25/19 1548           Sydnee Lamour A, PA-C 03/25/19 2115    Daleen Bo, MD 03/26/19 2326

## 2019-03-25 NOTE — ED Triage Notes (Signed)
Pt presents with c/o shortness of breath since October when she had Covid. Pt was seen for the same at [facility] 2 days ago. Pt was given an inhaler at [facility] but is requesting a breathing tx at this time. Pt is alert and oriented, no acute distress noted.

## 2019-03-25 NOTE — ED Notes (Signed)
ED Provider at bedside. 

## 2019-03-25 NOTE — Care Management (Signed)
ED CM spoke Dr. Effie Shy concerning patient needing cardio-pulmonary f/u. Patient was in the ED Friday 3/19 for related issues.  CM forwarded referral to [facility] at [facility] to reach out to patient to schedule an appointment, also place information on patient's AVS.

## 2019-03-26 ENCOUNTER — Telehealth: Payer: Self-pay

## 2019-03-26 NOTE — Telephone Encounter (Signed)
Patient is scheduled to see BI on 3/24.  Nothing further needed at this time- will close encounter.

## 2019-03-26 NOTE — Telephone Encounter (Signed)
-----   Message from Michel Bickers, RN sent at 03/25/2019  2:36 PM EDT ----- Regarding: ED follow-up Hi   I need your help with scheduling this patient for an appointment with [facility].  She was seen in the ED twice in the past 36 hours for respiratory issues.  The ED Provider wants her to be followed. Thank you if you have any question don't  hesitate to contact me.      Sherwood Gambler    Michel Bickers RN BSN CNOR  [facility]  [facility] Emergency Department/ RN Case Manager Direct Dial: [phone] email: [email or web address]

## 2019-03-28 ENCOUNTER — Ambulatory Visit (INDEPENDENT_AMBULATORY_CARE_PROVIDER_SITE_OTHER): Payer: PRIVATE HEALTH INSURANCE | Admitting: Pulmonary Disease

## 2019-03-28 ENCOUNTER — Encounter: Payer: Self-pay | Admitting: Pulmonary Disease

## 2019-03-28 ENCOUNTER — Telehealth: Payer: Self-pay | Admitting: Pulmonary Disease

## 2019-03-28 ENCOUNTER — Other Ambulatory Visit: Payer: Self-pay

## 2019-03-28 VITALS — BP 128/80 | HR 88 | Ht 65.0 in | Wt 229.6 lb

## 2019-03-28 DIAGNOSIS — R0602 Shortness of breath: Secondary | ICD-10-CM | POA: Diagnosis not present

## 2019-03-28 DIAGNOSIS — R062 Wheezing: Secondary | ICD-10-CM

## 2019-03-28 DIAGNOSIS — Z8616 Personal history of COVID-19: Secondary | ICD-10-CM

## 2019-03-28 DIAGNOSIS — R9431 Abnormal electrocardiogram [ECG] [EKG]: Secondary | ICD-10-CM

## 2019-03-28 MED ORDER — MONTELUKAST SODIUM 10 MG PO TABS: 10.0000 mg | ORAL_TABLET | Freq: Every day | ORAL | 11 refills | Status: AC

## 2019-03-28 MED ORDER — BREO ELLIPTA 200-25 MCG/INH IN AEPB: 1.0000 | INHALATION_SPRAY | Freq: Every day | RESPIRATORY_TRACT | 5 refills | Status: AC

## 2019-03-28 MED ORDER — BREO ELLIPTA 200-25 MCG/INH IN AEPB: 1.0000 | INHALATION_SPRAY | Freq: Every day | RESPIRATORY_TRACT | 0 refills | Status: DC

## 2019-03-28 NOTE — Telephone Encounter (Deleted)
OK what dx do you want to associate that with, thanks

## 2019-03-28 NOTE — Telephone Encounter (Signed)
Spoke with the pt  She states 03/26/19 at ED had EKG done and was told this was abnormal  She was having CP at the time but none today  She states wondering if needs to be referred to cards for this, was advised by doc in ED that could have been having some inflammation around her heart or lungs  Can you look at the EKG and advise? - it's in Epic, thanks!

## 2019-03-28 NOTE — Telephone Encounter (Signed)
I have made cards referral  Pt aware

## 2019-03-28 NOTE — Progress Notes (Signed)
Synopsis: Referred in March 2021 for "lung issues", self-referral PCP: By Lennie Odor, PA  Subjective:   PATIENT ID: Diana Chavez GENDER: female DOB: 1990/05/23, MRN: [id number]  Chief Complaint  Patient presents with  . Consult    Pt states she was in hosp x2 days due to SOB. Pt states she was diagnosed with covid Oct 5921.    29 year old female past medical history of thyroid disease.  Patient was diagnosed with COVID-19 in October 2020.  Subsequent testing in November had negative Covid screening.  At that time patient was admitted to [facility] on October 26, 2018 patient was treated with Decadron, remdesivir.  Patient was discharged on 10/31/2018.  Since her COVID-19 hospitalization she has had 3 ED visits for respiratory complaints to include bronchospasm, bronchitis symptoms and shortness of breath.  She has had chest x-ray imaging on each medical encounter.  Most recent chest imaging was March 2021.  Patient has not had any pulmonary function test..  OV 03/28/2019: ongoing trouble breathing since having covid. She has had recurrent issues with chest tightness and wheezing. She never really had trouble except for last spring pre-covid. She has seasonal allergies and spring time is the worse with multiple upper respiratory symptoms. She was given and atrovent inhaler and albuterol inhaler. Last use was last Monday. She was started on prednisone this past Sunday. She is feeling much better.    Past Medical History:  Diagnosis Date  . Obese   . Thyroid disease      Family History  Problem Relation Age of Onset  . Asthma Sister      Past Surgical History:  Procedure Laterality Date  . NO PAST SURGERIES      Social History   Socioeconomic History  . Marital status: Single    Spouse name: Not on file  . Number of children: Not on file  . Years of education: Not on file  . Highest education level: Not on file  Occupational History  . Not on file    Tobacco Use  . Smoking status: Never Smoker  . Smokeless tobacco: Never Used  Substance and Sexual Activity  . Alcohol use: Not Currently  . Drug use: Not Currently  . Sexual activity: Not Currently  Other Topics Concern  . Not on file  Social History Narrative  . Not on file   Social Determinants of Health   Financial Resource Strain:   . Difficulty of Paying Living Expenses:   Food Insecurity:   . Worried About Charity fundraiser in the Last Year:   . Arboriculturist in the Last Year:   Transportation Needs:   . Film/video editor (Medical):   Marland Kitchen Lack of Transportation (Non-Medical):   Physical Activity:   . Days of Exercise per Week:   . Minutes of Exercise per Session:   Stress:   . Feeling of Stress :   Social Connections:   . Frequency of Communication with Friends and Family:   . Frequency of Social Gatherings with Friends and Family:   . Attends Religious Services:   . Active Member of Clubs or Organizations:   . Attends Archivist Meetings:   Marland Kitchen Marital Status:   Intimate Partner Violence:   . Fear of Current or Ex-Partner:   . Emotionally Abused:   Marland Kitchen Physically Abused:   . Sexually Abused:      No Known Allergies   Outpatient Medications Prior to Visit  Medication Sig Dispense  Refill  . albuterol (VENTOLIN HFA) 108 (90 Base) MCG/ACT inhaler Inhale 1-2 puffs into the lungs every 6 (six) hours as needed for wheezing or shortness of breath. 8 g 0  . levothyroxine (SYNTHROID) 175 MCG tablet Take 175 mcg by mouth daily before breakfast.    . predniSONE (DELTASONE) 50 MG tablet Take 1 tablet (50 mg total) by mouth daily for 10 days. 10 tablet 0   No facility-administered medications prior to visit.    Review of Systems  Constitutional: Negative for chills, fever, malaise/fatigue and weight loss.  HENT: Negative for hearing loss, sore throat and tinnitus.   Eyes: Negative for blurred vision and double vision.  Respiratory: Positive for cough,  shortness of breath and wheezing. Negative for hemoptysis, sputum production and stridor.   Cardiovascular: Negative for chest pain, palpitations, orthopnea, leg swelling and PND.  Gastrointestinal: Negative for abdominal pain, constipation, diarrhea, heartburn, nausea and vomiting.  Genitourinary: Negative for dysuria, hematuria and urgency.  Musculoskeletal: Negative for joint pain and myalgias.  Skin: Negative for itching and rash.  Neurological: Negative for dizziness, tingling, weakness and headaches.  Endo/Heme/Allergies: Negative for environmental allergies. Does not bruise/bleed easily.  Psychiatric/Behavioral: Negative for depression. The patient is not nervous/anxious and does not have insomnia.   All other systems reviewed and are negative.    Objective:  Physical Exam Vitals reviewed.  Constitutional:      General: She is not in acute distress.    Appearance: She is well-developed. She is obese.  HENT:     Head: Normocephalic and atraumatic.  Eyes:     General: No scleral icterus.    Conjunctiva/sclera: Conjunctivae normal.     Pupils: Pupils are equal, round, and reactive to light.  Neck:     Vascular: No JVD.     Trachea: No tracheal deviation.  Cardiovascular:     Rate and Rhythm: Normal rate and regular rhythm.     Heart sounds: Normal heart sounds. No murmur.  Pulmonary:     Effort: Pulmonary effort is normal. No tachypnea, accessory muscle usage or respiratory distress.     Breath sounds: Wheezing present. No rales.  Abdominal:     General: Bowel sounds are normal.     Palpations: Abdomen is soft.  Musculoskeletal:        General: No tenderness.     Cervical back: Neck supple.     Right lower leg: No edema.     Left lower leg: No edema.  Lymphadenopathy:     Cervical: No cervical adenopathy.  Skin:    General: Skin is warm and dry.     Capillary Refill: Capillary refill takes less than 2 seconds.     Findings: No rash.  Neurological:     Mental  Status: She is alert and oriented to person, place, and time.  Psychiatric:        Behavior: Behavior normal.      Vitals:   03/28/19 1337  BP: 128/80  Pulse: 88  SpO2: 98%  Weight: 229 lb 9.6 oz (104.1 kg)  Height: 5\' 5"  (1.651 m)   98% on RA BMI Readings from Last 3 Encounters:  03/28/19 38.21 kg/m  03/23/19 36.61 kg/m  01/17/19 37.44 kg/m   Wt Readings from Last 3 Encounters:  03/28/19 229 lb 9.6 oz (104.1 kg)  03/23/19 220 lb (99.8 kg)  01/17/19 225 lb (102.1 kg)     CBC    Component Value Date/Time   WBC 9.9 03/25/2019 1318   RBC  5.09 03/25/2019 1318   HGB 15.3 (H) 03/25/2019 1318   HCT 47.7 (H) 03/25/2019 1318   PLT 303 03/25/2019 1318   MCV 93.7 03/25/2019 1318   MCH 30.1 03/25/2019 1318   MCHC 32.1 03/25/2019 1318   RDW 12.9 03/25/2019 1318   LYMPHSABS 4.0 03/25/2019 1318   MONOABS 0.9 03/25/2019 1318   EOSABS 0.6 (H) 03/25/2019 1318   BASOSABS 0.1 03/25/2019 1318     Chest Imaging: 03/25/2019 chest x-ray: No infiltrate.  No pulmonary edema.  No acute abnormality. The patient's images have been independently reviewed by me.    Pulmonary Functions Testing Results: No flowsheet data found.  FeNO: none   Pathology: none  Echocardiogram: none  Heart Catheterization: none    Assessment & Plan:     ICD-10-CM   1. Wheezing  R06.2   2. History of COVID-19  Z86.16   3. SOB (shortness of breath)  R06.02     Discussion: 29 year old female with recurrent shortness of breath wheezing episodes requiring multiple ED admissions over the past several months.  Diagnosed with COVID-19 in October 2020.  Since then has had a recurrent episodes of chest tightness bronchospasm wheezing treated with albuterol and prednisone which she improves.  I believe we are dealing with a diagnosis of severe persistent asthma symptoms.  Likely has a component of reactive airway disease post viral bronchitis related to COVID-19.  She is currently getting prednisone taper  at this time from her recent ED admission.  Currently experiencing acute exacerbation of asthma.  Plan Following Extensive Data Review & Interpretation:  . I reviewed prior external note(s) from 03/25/2019 Dr. Effie Shy emergency department visit, 03/23/2019 Dr. Lockie Mola emergency department visit . I reviewed the result(s) of CBC with differential, absolute eosinophil count 600, troponin negative, COVID-19 negative, BMP unremarkable . I have ordered pulmonary function test Independent interpretation of tests . Review of patient's past x-ray 03/25/2019 images revealed abnormality no infiltrate. The patient's images have been independently reviewed by me.    Breo Ellipta 200 daily Continue albuterol for shortness of breath and wheezing. Complete prednisone taper that she is currently on. Start Singulair 10 mg daily  6 to 8 weeks following pulmonary function test.    Current Outpatient Medications:  .  albuterol (VENTOLIN HFA) 108 (90 Base) MCG/ACT inhaler, Inhale 1-2 puffs into the lungs every 6 (six) hours as needed for wheezing or shortness of breath., Disp: 8 g, Rfl: 0 .  levothyroxine (SYNTHROID) 175 MCG tablet, Take 175 mcg by mouth daily before breakfast., Disp: , Rfl:  .  predniSONE (DELTASONE) 50 MG tablet, Take 1 tablet (50 mg total) by mouth daily for 10 days., Disp: 10 tablet, Rfl: 0   Josephine Igo, DO [facility] 03/28/2019 1:56 PM

## 2019-03-28 NOTE — Patient Instructions (Addendum)
Thank you for visiting Dr. Tonia Brooms at [facility]. Today we recommend the following:  Breo 200 samples and prescription  Continue albuterol as needed  Complete steroid taper you are currently on Pulmonary function tests prior to next office visit  Return in about 8 weeks (around 05/23/2019). Can see me or APP.     Please do your part to reduce the spread of COVID-19.

## 2019-03-28 NOTE — Addendum Note (Signed)
Addended by: Wyvonne Lenz on: 03/28/2019 02:07 PM   Modules accepted: Orders

## 2019-03-28 NOTE — Addendum Note (Signed)
Addended by: Wyvonne Lenz on: 03/28/2019 02:13 PM   Modules accepted: Orders

## 2019-03-28 NOTE — Telephone Encounter (Signed)
Spoke with patient. She verbalized understanding. I advised her to follow up with her PCP but she wants to know if Dr. Tonia Brooms can place the referral to cardiology for her.   Dr. Tonia Brooms, please advise. Thanks!

## 2019-03-28 NOTE — Telephone Encounter (Signed)
Yes, please place referral cardiology.   Josephine Igo, DO [facility] 03/28/2019 5:32 PM

## 2019-03-28 NOTE — Telephone Encounter (Signed)
The most recent in the ED demonstrated a prolonged Qtc She should follow up with PCP regarding this.  Occasionally this can be from medication effects  Josephine Igo, DO [facility] 03/28/2019 5:14 PM

## 2019-04-04 DIAGNOSIS — N898 Other specified noninflammatory disorders of vagina: Secondary | ICD-10-CM | POA: Insufficient documentation

## 2019-04-05 NOTE — Progress Notes (Signed)
Cardiology Office Note:    Date:  04/07/2019   ID:  Diana, Chavez 08/13/1990, MRN [id number]  PCP:  Milus Height, PA  Cardiologist:  Little Ishikawa, MD  Electrophysiologist:  None   Referring MD: Josephine Igo, DO   Chief Complaint  Patient presents with  . Shortness of Breath    History of Present Illness:    Diana Chavez is a 29 y.o. female with a hx of thyroid disease who is referred by Dr. Tonia Brooms for evaluation of QT prolongation.  Diagnosed with COVID-19 in October 2020, was admitted to [facility] and treated with Decadron and remdesivir.  Since that time, she has had recurrent episodes of chest tightness and wheezing, that have improved with albuterol and prednisone.  EKG on 03/25/2019 showed QTC 532, she was referred to cardiology for further evaluation.   She reports that since her Covid diagnosis, she has had persistent dyspnea.  Also describes intermittent chest pain, states this that feels like something is sitting on her chest.  She reports that since she saw Dr. Tonia Brooms on 3/24, she has had significant improvement in her symptoms.  Still has some shortness of breath but denies any chest pain.  Dyspnea significantly improved with inhalers.  No smoking history.  No history of heart disease in her immediate family.    Past Medical History:  Diagnosis Date  . Obese   . Thyroid disease     Past Surgical History:  Procedure Laterality Date  . NO PAST SURGERIES      Current Medications: Current Meds  Medication Sig  . albuterol (VENTOLIN HFA) 108 (90 Base) MCG/ACT inhaler Inhale 1-2 puffs into the lungs every 6 (six) hours as needed for wheezing or shortness of breath.  . fluticasone furoate-vilanterol (BREO ELLIPTA) 200-25 MCG/INH AEPB Inhale 1 puff into the lungs daily.  Marland Kitchen levothyroxine (SYNTHROID) 175 MCG tablet Take 175 mcg by mouth daily before breakfast.  . montelukast (SINGULAIR) 10 MG tablet Take 1 tablet (10 mg total) by mouth  at bedtime.     Allergies:   Patient has no known allergies.   Social History   Socioeconomic History  . Marital status: Single    Spouse name: Not on file  . Number of children: Not on file  . Years of education: Not on file  . Highest education level: Not on file  Occupational History  . Not on file  Tobacco Use  . Smoking status: Never Smoker  . Smokeless tobacco: Never Used  Substance and Sexual Activity  . Alcohol use: Not Currently  . Drug use: Not Currently  . Sexual activity: Not Currently  Other Topics Concern  . Not on file  Social History Narrative  . Not on file   Social Determinants of Health   Financial Resource Strain:   . Difficulty of Paying Living Expenses:   Food Insecurity:   . Worried About Programme researcher, broadcasting/film/video in the Last Year:   . Barista in the Last Year:   Transportation Needs:   . Freight forwarder (Medical):   Marland Kitchen Lack of Transportation (Non-Medical):   Physical Activity:   . Days of Exercise per Week:   . Minutes of Exercise per Session:   Stress:   . Feeling of Stress :   Social Connections:   . Frequency of Communication with Friends and Family:   . Frequency of Social Gatherings with Friends and Family:   . Attends Religious Services:   .  Active Member of Clubs or Organizations:   . Attends Archivist Meetings:   Marland Kitchen Marital Status:      Family History: The patient's family history includes Asthma in her sister.  ROS:   Please see the history of present illness.     All other systems reviewed and are negative.  EKGs/Labs/Other Studies Reviewed:    The following studies were reviewed today:   EKG:  EKG is  ordered today.  The ekg ordered today demonstrates normal sinus rhythm, nonspecific T wave flattening, QTC 440  Recent Labs: 10/29/2018: Magnesium 2.5 10/31/2018: ALT 89 03/25/2019: BUN 9; Creatinine, Ser 0.79; Hemoglobin 15.3; Platelets 303; Potassium 3.6; Sodium 141  Recent Lipid Panel No results  found for: CHOL, TRIG, HDL, CHOLHDL, VLDL, LDLCALC, LDLDIRECT  Physical Exam:    VS:  BP (!) 146/86   Pulse 80   Ht 5\' 5"  (1.651 m)   Wt 234 lb 12.8 oz (106.5 kg)   SpO2 94% Comment: Patient has acrylic nails on  BMI 72.09 kg/m     Wt Readings from Last 3 Encounters:  04/06/19 234 lb 12.8 oz (106.5 kg)  03/28/19 229 lb 9.6 oz (104.1 kg)  03/23/19 220 lb (99.8 kg)     GEN:  Well nourished, well developed in no acute distress HEENT: Normal NECK: No JVD LYMPHATICS: No lymphadenopathy CARDIAC: RRR, no murmurs, rubs, gallops RESPIRATORY:  Clear to auscultation without rales, wheezing or rhonchi  ABDOMEN: Soft, non-tender, non-distended MUSCULOSKELETAL:  No edema; No deformity  SKIN: Warm and dry NEUROLOGIC:  Alert and oriented x 3 PSYCHIATRIC:  Normal affect   ASSESSMENT:    1. Shortness of breath   2. QT prolongation    PLAN:     QT prolongation: QTC 532 on EKG from 3/21.  Unclear cause.  No clear medication culprit.  Could have been related to thyroid dysfunction or electrolyte abnormality.  QTC improved to 440 on EKG today.    Dyspnea: Persistent since COVID-19 infection in October.  Suspect pulmonary etiology, as improving with inhalers, but given there can be myocardial involvement with COVID-19, will check TTE for further evaluation  RTC in 1 month   Medication Adjustments/Labs and Tests Ordered: Current medicines are reviewed at length with the patient today.  Concerns regarding medicines are outlined above.  Orders Placed This Encounter  Procedures  . EKG 12-Lead  . ECHOCARDIOGRAM COMPLETE   No orders of the defined types were placed in this encounter.   Patient Instructions  Medication Instructions:  No changes  *If you need a refill on your cardiac medications before your next appointment, please call your pharmacy*   Lab Work: Not needed   Testing/Procedures: Will be schedule at [address] .Your physician has requested  that you have an echocardiogram. Echocardiography is a painless test that uses sound waves to create images of your heart. It provides your doctor with information about the size and shape of your heart and how well your heart's chambers and valves are working. This procedure takes approximately one hour. There are no restrictions for this procedure.     Follow-Up: At [facility], you and your health needs are our priority.  As part of our continuing mission to provide you with exceptional heart care, we have created designated Provider Care Teams.  These Care Teams include your primary Cardiologist (physician) and Advanced Practice Providers (APPs -  Physician Assistants and Nurse Practitioners) who all work together to provide you with the care  you need, when you need it.    Your next appointment:   1 month(s)  The format for your next appointment:   In Person  Provider:   Epifanio Lesches, MD   Other Instructions     Signed, Little Ishikawa, MD  04/07/2019 12:10 AM    [facility]

## 2019-04-06 ENCOUNTER — Encounter: Payer: Self-pay | Admitting: Cardiology

## 2019-04-06 ENCOUNTER — Telehealth: Payer: Self-pay | Admitting: Cardiology

## 2019-04-06 ENCOUNTER — Ambulatory Visit (INDEPENDENT_AMBULATORY_CARE_PROVIDER_SITE_OTHER): Payer: Self-pay | Admitting: Cardiology

## 2019-04-06 ENCOUNTER — Other Ambulatory Visit: Payer: Self-pay

## 2019-04-06 VITALS — BP 146/86 | HR 80 | Ht 65.0 in | Wt 234.8 lb

## 2019-04-06 DIAGNOSIS — R0602 Shortness of breath: Secondary | ICD-10-CM

## 2019-04-06 DIAGNOSIS — R9431 Abnormal electrocardiogram [ECG] [EKG]: Secondary | ICD-10-CM

## 2019-04-06 NOTE — Telephone Encounter (Signed)
Contacted patient- advised that we are not allowing guests at this time for the visit- but we could call or video her mother during the visit.  Patient verbalized understanding.

## 2019-04-06 NOTE — Patient Instructions (Signed)
Medication Instructions:  No changes  *If you need a refill on your cardiac medications before your next appointment, please call your pharmacy*   Lab Work: Not needed   Testing/Procedures: Will be schedule at [address] .Your physician has requested that you have an echocardiogram. Echocardiography is a painless test that uses sound waves to create images of your heart. It provides your doctor with information about the size and shape of your heart and how well your heart's chambers and valves are working. This procedure takes approximately one hour. There are no restrictions for this procedure.     Follow-Up: At [facility], you and your health needs are our priority.  As part of our continuing mission to provide you with exceptional heart care, we have created designated Provider Care Teams.  These Care Teams include your primary Cardiologist (physician) and Advanced Practice Providers (APPs -  Physician Assistants and Nurse Practitioners) who all work together to provide you with the care you need, when you need it.    Your next appointment:   1 month(s)  The format for your next appointment:   In Person  Provider:   Epifanio Lesches, MD   Other Instructions

## 2019-04-06 NOTE — Telephone Encounter (Signed)
New message  Patient is calling in to get approval for her mother to accompany her to her appointment. States that this is her first visit and her mother is concerned and has questions for the doctor. Please give patient a call back to confirm.

## 2019-04-18 ENCOUNTER — Other Ambulatory Visit: Payer: Self-pay

## 2019-04-18 ENCOUNTER — Ambulatory Visit (HOSPITAL_COMMUNITY): Payer: Self-pay | Attending: Cardiovascular Disease

## 2019-04-18 DIAGNOSIS — R0602 Shortness of breath: Secondary | ICD-10-CM | POA: Insufficient documentation

## 2019-05-11 NOTE — Progress Notes (Deleted)
Cardiology Office Note:    Date:  05/11/2019   ID:  Jihan, Mellette 1990/10/04, MRN [id number]  PCP:  Lennie Odor, PA  Cardiologist:  Donato Heinz, MD  Electrophysiologist:  None   Referring MD: Lennie Odor, PA   No chief complaint on file.   History of Present Illness:    Diana Chavez is a 29 y.o. female with a hx of thyroid disease who presents for follow-up.  She was referred by Dr. Valeta Harms for evaluation of QT prolongation.  Diagnosed with COVID-19 in October 2020, was admitted to [facility] and treated with Decadron and remdesivir.  Since that time, she has had recurrent episodes of chest tightness and wheezing, that have improved with albuterol and prednisone.  EKG on 03/25/2019 showed QTC 532, she was referred to cardiology for further evaluation.  At initial clinic visit on 04/06/2019, QTc had improved to 440.  TTE on 04/18/2019 showed normal biventricular function, no significant valvular disease.     Past Medical History:  Diagnosis Date  . Obese   . Thyroid disease     Past Surgical History:  Procedure Laterality Date  . NO PAST SURGERIES      Current Medications: No outpatient medications have been marked as taking for the 05/14/19 encounter (Appointment) with Donato Heinz, MD.     Allergies:   Patient has no known allergies.   Social History   Socioeconomic History  . Marital status: Single    Spouse name: Not on file  . Number of children: Not on file  . Years of education: Not on file  . Highest education level: Not on file  Occupational History  . Not on file  Tobacco Use  . Smoking status: Never Smoker  . Smokeless tobacco: Never Used  Substance and Sexual Activity  . Alcohol use: Not Currently  . Drug use: Not Currently  . Sexual activity: Not Currently  Other Topics Concern  . Not on file  Social History Narrative  . Not on file   Social Determinants of Health   Financial Resource Strain:     . Difficulty of Paying Living Expenses:   Food Insecurity:   . Worried About Charity fundraiser in the Last Year:   . Arboriculturist in the Last Year:   Transportation Needs:   . Film/video editor (Medical):   Marland Kitchen Lack of Transportation (Non-Medical):   Physical Activity:   . Days of Exercise per Week:   . Minutes of Exercise per Session:   Stress:   . Feeling of Stress :   Social Connections:   . Frequency of Communication with Friends and Family:   . Frequency of Social Gatherings with Friends and Family:   . Attends Religious Services:   . Active Member of Clubs or Organizations:   . Attends Archivist Meetings:   Marland Kitchen Marital Status:      Family History: The patient's family history includes Asthma in her sister.  ROS:   Please see the history of present illness.     All other systems reviewed and are negative.  EKGs/Labs/Other Studies Reviewed:    The following studies were reviewed today:   EKG:  EKG is  ordered today.  The ekg ordered today demonstrates normal sinus rhythm, nonspecific T wave flattening, QTC 440  Recent Labs: 10/29/2018: Magnesium 2.5 10/31/2018: ALT 89 03/25/2019: BUN 9; Creatinine, Ser 0.79; Hemoglobin 15.3; Platelets 303; Potassium 3.6; Sodium 141  Recent Lipid Panel No  results found for: CHOL, TRIG, HDL, CHOLHDL, VLDL, LDLCALC, LDLDIRECT  Physical Exam:    VS:  There were no vitals taken for this visit.    Wt Readings from Last 3 Encounters:  04/06/19 234 lb 12.8 oz (106.5 kg)  03/28/19 229 lb 9.6 oz (104.1 kg)  03/23/19 220 lb (99.8 kg)     GEN:  Well nourished, well developed in no acute distress HEENT: Normal NECK: No JVD LYMPHATICS: No lymphadenopathy CARDIAC: RRR, no murmurs, rubs, gallops RESPIRATORY:  Clear to auscultation without rales, wheezing or rhonchi  ABDOMEN: Soft, non-tender, non-distended MUSCULOSKELETAL:  No edema; No deformity  SKIN: Warm and dry NEUROLOGIC:  Alert and oriented x 3 PSYCHIATRIC:   Normal affect   ASSESSMENT:    No diagnosis found. PLAN:     QT prolongation: QTC 532 on EKG from 3/21.  Unclear cause.  No clear medication culprit.  Could have been related to thyroid dysfunction or electrolyte abnormality.  QTC improved to 440 on EKG at initial clinic visit.  Dyspnea: Persistent since COVID-19 infection in October.  Suspect pulmonary etiology.  No structural heart disease on TTE  RTC in***   Medication Adjustments/Labs and Tests Ordered: Current medicines are reviewed at length with the patient today.  Concerns regarding medicines are outlined above.  No orders of the defined types were placed in this encounter.  No orders of the defined types were placed in this encounter.   There are no Patient Instructions on file for this visit.   Signed, Little Ishikawa, MD  05/11/2019 9:04 PM    [facility]

## 2019-05-14 ENCOUNTER — Ambulatory Visit: Payer: Self-pay | Admitting: Cardiology

## 2019-05-18 ENCOUNTER — Other Ambulatory Visit (HOSPITAL_COMMUNITY): Payer: PRIVATE HEALTH INSURANCE

## 2019-05-22 ENCOUNTER — Ambulatory Visit: Payer: Self-pay | Admitting: Cardiology

## 2019-05-23 ENCOUNTER — Ambulatory Visit: Payer: PRIVATE HEALTH INSURANCE | Admitting: Adult Health

## 2020-08-04 ENCOUNTER — Other Ambulatory Visit: Payer: Self-pay

## 2020-08-04 ENCOUNTER — Ambulatory Visit
Admission: RE | Admit: 2020-08-04 | Discharge: 2020-08-04 | Disposition: A | Discharge status: Home / Self Care | Payer: Self-pay | Source: Ambulatory Visit | Attending: Emergency Medicine | Admitting: Emergency Medicine

## 2020-08-04 VITALS — BP 122/90 | HR 78 | Temp 98.3°F | Resp 18

## 2020-08-04 DIAGNOSIS — J069 Acute upper respiratory infection, unspecified: Secondary | ICD-10-CM

## 2020-08-04 MED ORDER — BENZONATATE 100 MG PO CAPS: 100.0000 mg | ORAL_CAPSULE | Freq: Three times a day (TID) | ORAL | 0 refills | Status: AC

## 2020-08-04 MED ORDER — FLUTICASONE PROPIONATE 50 MCG/ACT NA SUSP: 1.0000 | Freq: Every day | NASAL | 0 refills | Status: AC

## 2020-08-04 MED ORDER — IBUPROFEN 600 MG PO TABS: 600.0000 mg | ORAL_TABLET | Freq: Four times a day (QID) | ORAL | 0 refills | Status: AC | PRN

## 2020-08-04 MED ORDER — DM-GUAIFENESIN ER 30-600 MG PO TB12: 1.0000 | ORAL_TABLET | Freq: Two times a day (BID) | ORAL | 0 refills | Status: AC

## 2020-08-04 NOTE — Discharge Instructions (Addendum)
Rest and fluids Ibuprofen and Tylenol for pain, headache, body aches Daily zyrtec, flonase nasal spray to help with pressure and fluid on ears Mucinex Dm twice daily  Tessalon for cough Follow up if not improving or worsening

## 2020-08-04 NOTE — ED Triage Notes (Signed)
Pt presents with c/o sore throat, swelling in throat, head congestion and cough X 4 days.   States the cough became productive yesterday. States she started feeling fatigued. Pt states she [place] [facility] and reports her test came back negative.   Denies fever.

## 2020-08-04 NOTE — ED Provider Notes (Signed)
[facility]    CSN: [id number] Arrival date & time: 08/04/20  1143      History   Chief Complaint Chief Complaint  Patient presents with   Appointment    1130   Sore Throat   Cough   Head Congestion    HPI Kestrel Mis is a 30 y.o. female presenting today for evaluation of URI symptoms.  Reports that she has had associated cough, congestion, sneezing.  Has had fatigue. Symptoms began 4 days ago. Denies GI symptoms. Sore  throat slightly improved now. Worsening productive cough. Prior COVID PCR negative.   HPI  Past Medical History:  Diagnosis Date   Obese    Thyroid disease     Patient Active Problem List   Diagnosis Date Noted   Vaginal discharge 04/04/2019   Pneumonia due to COVID-19 virus 10/27/2018   Hypokalemia 10/27/2018   Elevated transaminase level 10/27/2018   Acute respiratory failure with hypoxia (HCC) 10/27/2018   Thyroid disease     Past Surgical History:  Procedure Laterality Date   NO PAST SURGERIES      OB History   No obstetric history on file.      Home Medications    Prior to Admission medications   Medication Sig Start Date End Date Taking? Authorizing Provider  benzonatate (TESSALON) 100 MG capsule Take 1-2 capsules (100-200 mg total) by mouth every 8 (eight) hours. 08/04/20  Yes Velia Pamer C, PA-C  dextromethorphan-guaiFENesin (MUCINEX DM) 30-600 MG 12hr tablet Take 1 tablet by mouth 2 (two) times daily. 08/04/20  Yes Vielka Klinedinst C, PA-C  fluticasone (FLONASE) 50 MCG/ACT nasal spray Place 1-2 sprays into both nostrils daily. 08/04/20  Yes Naileah Karg C, PA-C  ibuprofen (ADVIL) 600 MG tablet Take 1 tablet (600 mg total) by mouth every 6 (six) hours as needed. 08/04/20  Yes Graesyn Schreifels C, PA-C  albuterol (VENTOLIN HFA) 108 (90 Base) MCG/ACT inhaler Inhale 1-2 puffs into the lungs every 6 (six) hours as needed for wheezing or shortness of breath. 03/25/19   Henderly, Britni A, PA-C  fluticasone furoate-vilanterol  (BREO ELLIPTA) 200-25 MCG/INH AEPB Inhale 1 puff into the lungs daily. 03/28/19   Icard, Rachel Bo, DO  levothyroxine (SYNTHROID) 175 MCG tablet Take 175 mcg by mouth daily before breakfast.    [provider]  montelukast (SINGULAIR) 10 MG tablet Take 1 tablet (10 mg total) by mouth at bedtime. 03/28/19   Josephine Igo, DO    Family History Family History  Problem Relation Age of Onset   Asthma Sister     Social History Social History   Tobacco Use   Smoking status: Never   Smokeless tobacco: Never  Substance Use Topics   Alcohol use: Not Currently   Drug use: Not Currently     Allergies   Patient has no known allergies.   Review of Systems Review of Systems  Constitutional:  Positive for fatigue. Negative for activity change, appetite change, chills and fever.  HENT:  Positive for congestion, rhinorrhea, sinus pressure and sore throat. Negative for ear pain and trouble swallowing.   Eyes:  Negative for discharge and redness.  Respiratory:  Positive for cough. Negative for chest tightness and shortness of breath.   Cardiovascular:  Negative for chest pain.  Gastrointestinal:  Negative for abdominal pain, diarrhea, nausea and vomiting.  Musculoskeletal:  Negative for myalgias.  Skin:  Negative for rash.  Neurological:  Negative for dizziness, light-headedness and headaches.    Physical Exam Triage Vital  Signs ED Triage Vitals  Enc Vitals Group     BP      Pulse      Resp      Temp      Temp src      SpO2      Weight      Height      Head Circumference      Peak Flow      Pain Score      Pain Loc      Pain Edu?      Excl. in GC?    No data found.  Updated Vital Signs BP 122/90 (BP Location: Right Arm)   Pulse 78   Temp 98.3 F (36.8 C) (Oral)   Resp 18   LMP 03/04/2020 (Exact Date)   SpO2 97%   Visual Acuity Right Eye Distance:   Left Eye Distance:   Bilateral Distance:    Right Eye Near:   Left Eye Near:    Bilateral Near:      Physical Exam Vitals and nursing note reviewed.  Constitutional:      Appearance: She is well-developed.     Comments: No acute distress  HENT:     Head: Normocephalic and atraumatic.     Ears:     Comments: Bilateral ears without tenderness to palpation of external auricle, tragus and mastoid, EAC's without erythema or swelling, TM's with good bony landmarks and cone of light. Non erythematous.      Nose: Nose normal.     Mouth/Throat:     Comments: Oral mucosa pink and moist, no tonsillar enlargement or exudate. Posterior pharynx patent and nonerythematous, no uvula deviation or swelling. Normal phonation.  Eyes:     Conjunctiva/sclera: Conjunctivae normal.  Cardiovascular:     Rate and Rhythm: Normal rate and regular rhythm.  Pulmonary:     Effort: Pulmonary effort is normal. No respiratory distress.     Comments: Breathing comfortably at rest, CTABL, no wheezing, rales or other adventitious sounds auscultated  Abdominal:     General: There is no distension.  Musculoskeletal:        General: Normal range of motion.     Cervical back: Neck supple.  Skin:    General: Skin is warm and dry.  Neurological:     Mental Status: She is alert and oriented to person, place, and time.     UC Treatments / Results  Labs (all labs ordered are listed, but only abnormal results are displayed) Labs Reviewed - No data to display  EKG   Radiology No results found.  Procedures Procedures (including critical care time)  Medications Ordered in UC Medications - No data to display  Initial Impression / Assessment and Plan / UC Course  I have reviewed the triage vital signs and the nursing notes.  Pertinent labs & imaging results that were available during my care of the patient were reviewed by me and considered in my medical decision making (see chart for details).     Viral URI with cough- exam reassuring, lungs clear to auscultation, suspect viral etiology and recommend  symptomatic and supportive care at this time.  Recommendations provided.  Rest and fluids.  Continue to monitor.  Discussed strict return precautions. Patient verbalized understanding and is agreeable with plan.   Final Clinical Impressions(s) / UC Diagnoses   Final diagnoses:  Viral URI with cough     Discharge Instructions      Rest and fluids Ibuprofen and Tylenol  for pain, headache, body aches Daily zyrtec, flonase nasal spray to help with pressure and fluid on ears Mucinex Dm twice daily  Tessalon for cough Follow up if not improving or worsening     ED Prescriptions     Medication Sig Dispense Auth. Provider   benzonatate (TESSALON) 100 MG capsule Take 1-2 capsules (100-200 mg total) by mouth every 8 (eight) hours. 21 capsule Nomie Buchberger C, PA-C   dextromethorphan-guaiFENesin (MUCINEX DM) 30-600 MG 12hr tablet Take 1 tablet by mouth 2 (two) times daily. 15 tablet Brecklyn Galvis C, PA-C   ibuprofen (ADVIL) 600 MG tablet Take 1 tablet (600 mg total) by mouth every 6 (six) hours as needed. 30 tablet Janiel Derhammer C, PA-C   fluticasone (FLONASE) 50 MCG/ACT nasal spray Place 1-2 sprays into both nostrils daily. 16 g Zuriyah Shatz, [place] C, PA-C      PDMP not reviewed this encounter.   Lew Dawes, PA-C 08/04/20 1215

## 2020-11-06 IMAGING — DX DG CHEST 1V PORT
1 series · 1 of 1 positions shown · non-contrast
Comparison: None.

CLINICAL DATA: Covid positive, cough

EXAM:
PORTABLE CHEST 1 VIEW

[chest ap]
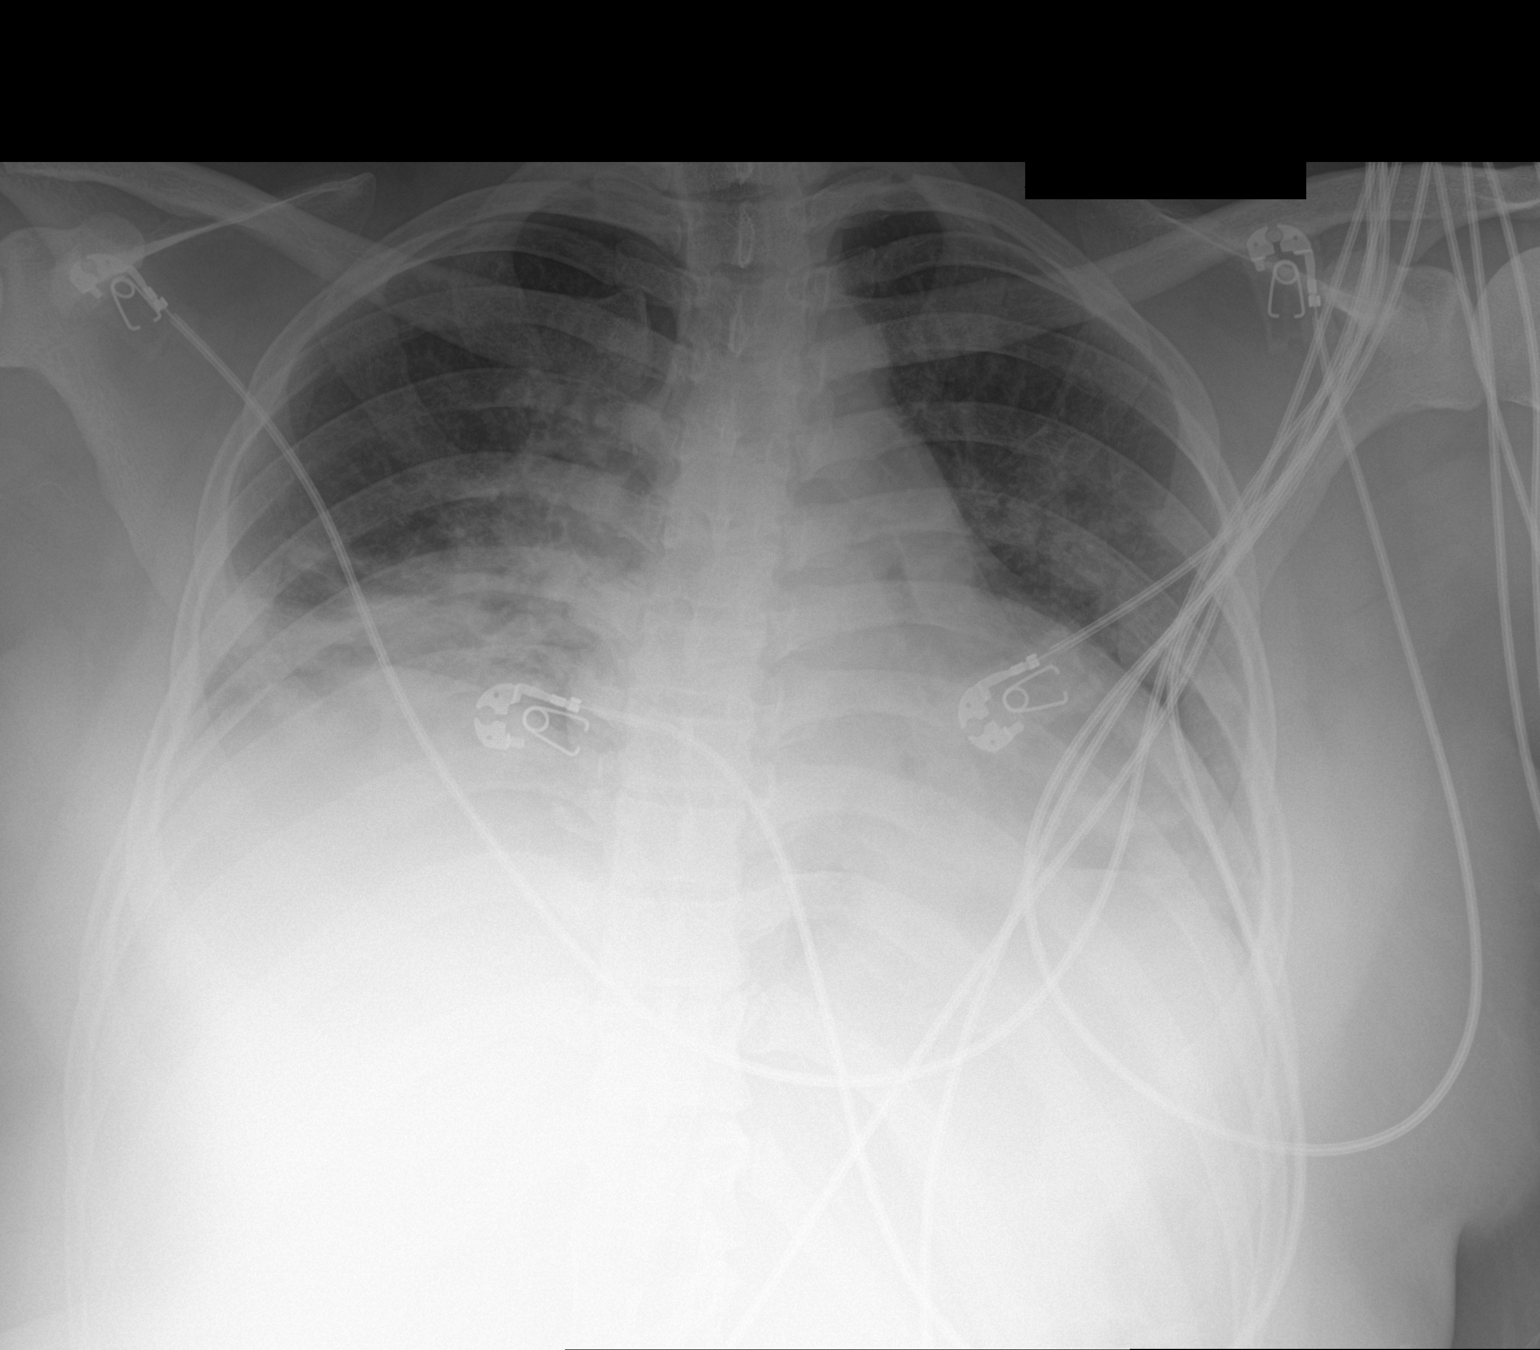

[1 of 1 positions shown; findings below may reference images not displayed]

FINDINGS: The heart size and mediastinal contours are within normal limits.
Patchy airspace consolidation seen at the base of the right lung and
left mid lung. Overall shallow degree of aeration.
IMPRESSION: Patchy airspace opacities in both lungs, right greater than left.
The findings in the lungs are nonspecific, but concerning for
atypical infection, which includes viral pneumonia.

## 2021-04-05 IMAGING — CR DG CHEST 2V
2 series · 2 of 2 positions shown · non-contrast
Comparison: Chest radiograph 03/23/2019

CLINICAL DATA: Pt presented with c/o shortness of breath since
[REDACTED] when she had Covid.

EXAM:
CHEST - 2 VIEW

[w chest pa]
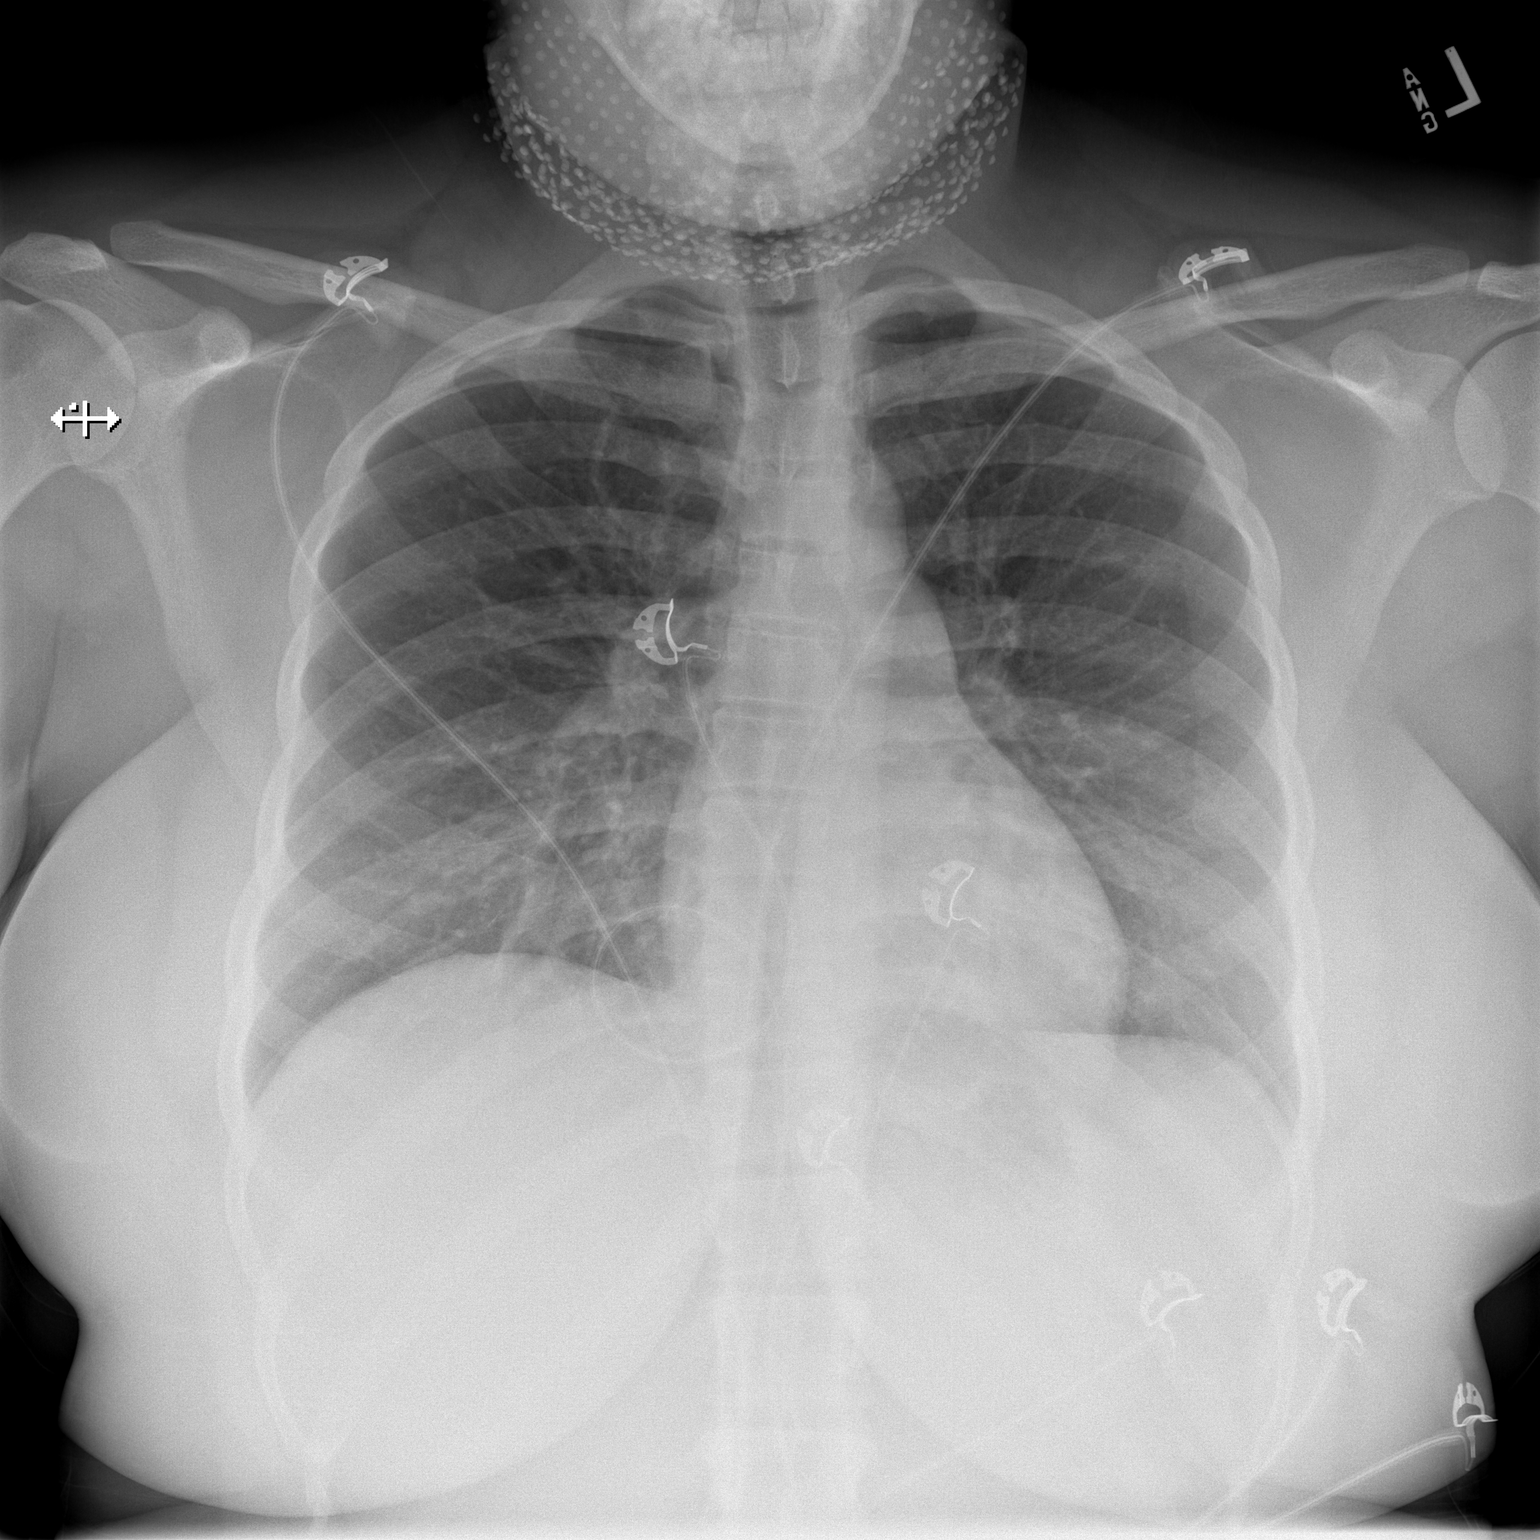

[w chest lat]
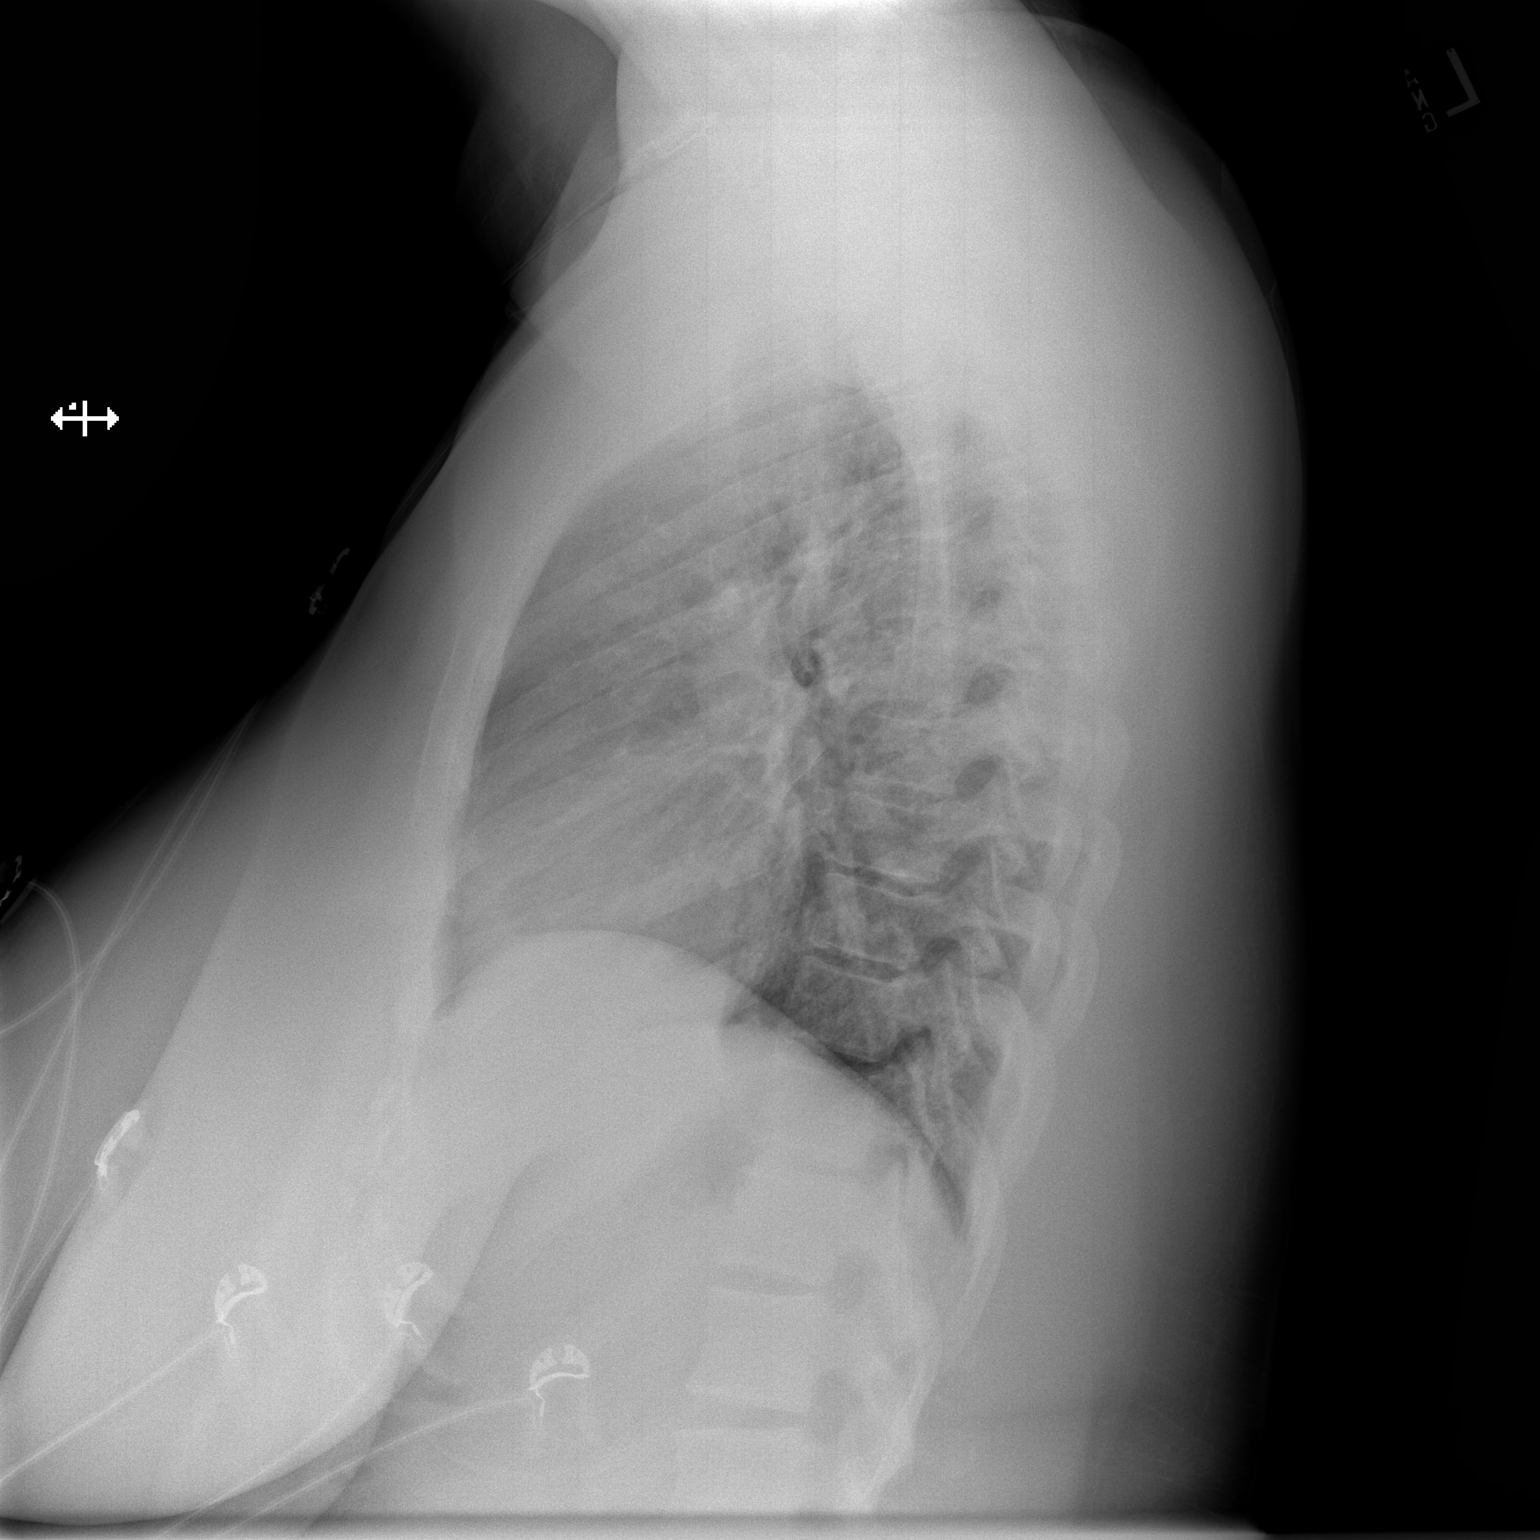

[2 of 2 positions shown; findings below may reference images not displayed]

FINDINGS: The heart size and mediastinal contours are within normal limits.
The lungs are clear. No pneumothorax or pleural effusion. The
visualized skeletal structures are unremarkable.
IMPRESSION: No acute cardiopulmonary finding.

## 2021-12-25 ENCOUNTER — Ambulatory Visit
Admission: EM | Admit: 2021-12-25 | Discharge: 2021-12-25 | Disposition: A | Discharge status: Home / Self Care | Payer: BC Managed Care – PPO | Attending: Urgent Care | Admitting: Urgent Care

## 2021-12-25 DIAGNOSIS — R030 Elevated blood-pressure reading, without diagnosis of hypertension: Secondary | ICD-10-CM | POA: Diagnosis not present

## 2021-12-25 DIAGNOSIS — Z1152 Encounter for screening for COVID-19: Secondary | ICD-10-CM | POA: Diagnosis not present

## 2021-12-25 DIAGNOSIS — Z20828 Contact with and (suspected) exposure to other viral communicable diseases: Secondary | ICD-10-CM | POA: Insufficient documentation

## 2021-12-25 DIAGNOSIS — J309 Allergic rhinitis, unspecified: Secondary | ICD-10-CM

## 2021-12-25 DIAGNOSIS — J4530 Mild persistent asthma, uncomplicated: Secondary | ICD-10-CM | POA: Insufficient documentation

## 2021-12-25 DIAGNOSIS — R509 Fever, unspecified: Secondary | ICD-10-CM | POA: Diagnosis present

## 2021-12-25 DIAGNOSIS — B349 Viral infection, unspecified: Secondary | ICD-10-CM | POA: Diagnosis not present

## 2021-12-25 MED ORDER — OSELTAMIVIR PHOSPHATE 6 MG/ML PO SUSR: 75.0000 mg | Freq: Two times a day (BID) | ORAL | 0 refills | Status: AC

## 2021-12-25 MED ORDER — ALBUTEROL SULFATE HFA 108 (90 BASE) MCG/ACT IN AERS: 1.0000 | INHALATION_SPRAY | Freq: Four times a day (QID) | RESPIRATORY_TRACT | 0 refills | Status: AC | PRN

## 2021-12-25 MED ORDER — PROMETHAZINE-DM 6.25-15 MG/5ML PO SYRP: 5.0000 mL | ORAL_SOLUTION | Freq: Three times a day (TID) | ORAL | 0 refills | Status: AC | PRN

## 2021-12-25 MED ORDER — PREDNISONE 20 MG PO TABS: ORAL_TABLET | ORAL | 0 refills | Status: AC

## 2021-12-25 MED ORDER — CETIRIZINE HCL 10 MG PO TABS: 10.0000 mg | ORAL_TABLET | Freq: Every day | ORAL | 0 refills | Status: AC

## 2021-12-25 MED ORDER — PSEUDOEPHEDRINE HCL 60 MG PO TABS: 60.0000 mg | ORAL_TABLET | Freq: Three times a day (TID) | ORAL | 0 refills | Status: AC | PRN

## 2021-12-25 NOTE — ED Provider Notes (Signed)
[facility]  Note:  This document was prepared using Conservation officer, historic buildings and may include unintentional dictation errors.  MRN: [id number] DOB: Jun 20, 1990  Subjective:   Diana Chavez is a 31 y.o. female presenting for 3-day history of acute onset persistent fever, chills, body aches, coughing.  Has a history of asthma.  Had multiple exposures to influenza.  No current facility-administered medications for this encounter.  Current Outpatient Medications:    albuterol (VENTOLIN HFA) 108 (90 Base) MCG/ACT inhaler, Inhale 1-2 puffs into the lungs every 6 (six) hours as needed for wheezing or shortness of breath., Disp: 8 g, Rfl: 0   benzonatate (TESSALON) 100 MG capsule, Take 1-2 capsules (100-200 mg total) by mouth every 8 (eight) hours., Disp: 21 capsule, Rfl: 0   dextromethorphan-guaiFENesin (MUCINEX DM) 30-600 MG 12hr tablet, Take 1 tablet by mouth 2 (two) times daily., Disp: 15 tablet, Rfl: 0   fluticasone (FLONASE) 50 MCG/ACT nasal spray, Place 1-2 sprays into both nostrils daily., Disp: 16 g, Rfl: 0   fluticasone furoate-vilanterol (BREO ELLIPTA) 200-25 MCG/INH AEPB, Inhale 1 puff into the lungs daily., Disp: 60 each, Rfl: 5   ibuprofen (ADVIL) 600 MG tablet, Take 1 tablet (600 mg total) by mouth every 6 (six) hours as needed., Disp: 30 tablet, Rfl: 0   levothyroxine (SYNTHROID) 175 MCG tablet, Take 175 mcg by mouth daily before breakfast., Disp: , Rfl:    montelukast (SINGULAIR) 10 MG tablet, Take 1 tablet (10 mg total) by mouth at bedtime., Disp: 30 tablet, Rfl: 11   No Known Allergies  Past Medical History:  Diagnosis Date   Asthma    Obese    Thyroid disease      Past Surgical History:  Procedure Laterality Date   NO PAST SURGERIES      Family History  Problem Relation Age of Onset   Asthma Sister     Social History   Tobacco Use   Smoking status: Never   Smokeless tobacco: Never  Vaping Use   Vaping Use: Never used   Substance Use Topics   Alcohol use: Not Currently   Drug use: Not Currently    ROS   Objective:   Vitals: BP (!) 167/111 (BP Location: Left Arm)   Pulse 94   Temp 100.2 F (37.9 C) (Oral)   Resp 18   LMP 12/13/2021 Comment: hx of irreg pds  SpO2 99%   BP Readings from Last 3 Encounters:  12/25/21 (!) 167/111  08/04/20 122/90  04/06/19 (!) 146/86   Physical Exam Constitutional:      General: She is not in acute distress.    Appearance: Normal appearance. She is well-developed and normal weight. She is not ill-appearing, toxic-appearing or diaphoretic.  HENT:     Head: Normocephalic and atraumatic.     Right Ear: Tympanic membrane, ear canal and external ear normal. No drainage or tenderness. No middle ear effusion. There is no impacted cerumen. Tympanic membrane is not erythematous or bulging.     Left Ear: Tympanic membrane, ear canal and external ear normal. No drainage or tenderness.  No middle ear effusion. There is no impacted cerumen. Tympanic membrane is not erythematous or bulging.     Nose: Nose normal. No congestion or rhinorrhea.     Mouth/Throat:     Mouth: Mucous membranes are moist. No oral lesions.     Pharynx: No pharyngeal swelling, oropharyngeal exudate, posterior oropharyngeal erythema or uvula swelling.     Tonsils: No tonsillar  exudate or tonsillar abscesses.  Eyes:     General: No scleral icterus.       Right eye: No discharge.        Left eye: No discharge.     Extraocular Movements: Extraocular movements intact.     Right eye: Normal extraocular motion.     Left eye: Normal extraocular motion.     Conjunctiva/sclera: Conjunctivae normal.  Cardiovascular:     Rate and Rhythm: Normal rate and regular rhythm.     Heart sounds: Normal heart sounds. No murmur heard.    No friction rub. No gallop.  Pulmonary:     Effort: Pulmonary effort is normal. No respiratory distress.     Breath sounds: No stridor. No wheezing, rhonchi or rales.  Chest:      Chest wall: No tenderness.  Musculoskeletal:     Cervical back: Normal range of motion and neck supple.  Lymphadenopathy:     Cervical: No cervical adenopathy.  Skin:    General: Skin is warm and dry.  Neurological:     General: No focal deficit present.     Mental Status: She is alert and oriented to person, place, and time.  Psychiatric:        Mood and Affect: Mood normal.        Behavior: Behavior normal.        Thought Content: Thought content normal.        Judgment: Judgment normal.     Assessment and Plan :   PDMP not reviewed this encounter.  1. Acute viral syndrome   2. Exposure to influenza   3. Mild persistent asthma without complication   4. Allergic rhinitis, unspecified seasonality, unspecified trigger   5. Elevated blood pressure reading without diagnosis of hypertension     In the context of her asthma, respiratory symptoms recommended oral prednisone course.  Will cover for influenza with Tamiflu given exposures, symptom set, current incidence in the community.  Use supportive care, rest, fluids, hydration, light meals, schedule Tylenol.  Will check for COVID-19. Deferred imaging given clear cardiopulmonary exam, hemodynamically stable vital signs. Counseled patient on potential for adverse effects with medications prescribed today, patient verbalized understanding. ER and return-to-clinic precautions discussed, patient verbalized understanding.    Jaynee Eagles, [place] 12/25/21 [id number]

## 2021-12-25 NOTE — ED Triage Notes (Signed)
Pt c/o cough, fever, chills, body aches x 3 days-NAD-steady gait

## 2021-12-25 NOTE — Discharge Instructions (Addendum)
We will manage this as a viral syndrome like influenza given your exposure. Start Tamiflu but if you test positive for COVID then we'll have you stop that and switch you to Paxlovid. For sore throat or cough try using a honey-based tea. Use 3 teaspoons of honey with juice squeezed from half lemon. Place shaved pieces of ginger into 1/2-1 cup of water and warm over stove top. Then mix the ingredients and repeat every 4 hours as needed. Please take Tylenol 500mg -650mg  once every 6 hours for fevers, aches and pains. Hydrate very well with at least 2 liters (64 ounces) of water. Eat light meals such as soups (chicken and noodles, chicken wild rice, vegetable).  Do not eat any foods that you are allergic to.  Start an antihistamine like Zyrtec for postnasal drainage, sinus congestion.  You can take this together with prednisone and the inhaler.

## 2021-12-26 LAB — SARS CORONAVIRUS 2 (TAT 6-24 HRS): SARS Coronavirus 2: NEGATIVE
